# Patient Record
Sex: Male | Born: 1940
Health system: Southern US, Community
[De-identification: ages and names within clinical notes are randomized; demographics above are authoritative.]

## PROBLEM LIST (undated history)

## (undated) DIAGNOSIS — K219 Gastro-esophageal reflux disease without esophagitis: Secondary | ICD-10-CM

## (undated) DIAGNOSIS — I10 Essential (primary) hypertension: Secondary | ICD-10-CM

## (undated) DIAGNOSIS — M0600 Rheumatoid arthritis without rheumatoid factor, unspecified site: Secondary | ICD-10-CM

## (undated) DIAGNOSIS — J449 Chronic obstructive pulmonary disease, unspecified: Secondary | ICD-10-CM

## (undated) DIAGNOSIS — E119 Type 2 diabetes mellitus without complications: Secondary | ICD-10-CM

## (undated) DIAGNOSIS — I700 Atherosclerosis of aorta: Secondary | ICD-10-CM

## (undated) DIAGNOSIS — N1832 Chronic kidney disease, stage 3b: Secondary | ICD-10-CM

---

## 1997-09-29 ENCOUNTER — Ambulatory Visit (HOSPITAL_COMMUNITY): Admission: RE | Admit: 1997-09-29 | Discharge: 1997-09-29 | Payer: Self-pay | Admitting: Family Medicine

## 2020-06-04 ENCOUNTER — Encounter (HOSPITAL_BASED_OUTPATIENT_CLINIC_OR_DEPARTMENT_OTHER): Payer: Self-pay | Admitting: *Deleted

## 2020-06-04 ENCOUNTER — Emergency Department (HOSPITAL_BASED_OUTPATIENT_CLINIC_OR_DEPARTMENT_OTHER): Payer: Medicare HMO

## 2020-06-04 ENCOUNTER — Other Ambulatory Visit: Payer: Self-pay

## 2020-06-04 ENCOUNTER — Inpatient Hospital Stay (HOSPITAL_BASED_OUTPATIENT_CLINIC_OR_DEPARTMENT_OTHER)
Admission: EM | Admit: 2020-06-04 | Discharge: 2020-06-11 | DRG: 641 | Disposition: A | Discharge status: Home / Self Care | Payer: Medicare HMO | Attending: Internal Medicine | Admitting: Internal Medicine

## 2020-06-04 DIAGNOSIS — Z7982 Long term (current) use of aspirin: Secondary | ICD-10-CM

## 2020-06-04 DIAGNOSIS — N179 Acute kidney failure, unspecified: Secondary | ICD-10-CM | POA: Diagnosis present

## 2020-06-04 DIAGNOSIS — R410 Disorientation, unspecified: Secondary | ICD-10-CM

## 2020-06-04 DIAGNOSIS — I10 Essential (primary) hypertension: Secondary | ICD-10-CM | POA: Diagnosis present

## 2020-06-04 DIAGNOSIS — E119 Type 2 diabetes mellitus without complications: Secondary | ICD-10-CM

## 2020-06-04 DIAGNOSIS — K219 Gastro-esophageal reflux disease without esophagitis: Secondary | ICD-10-CM | POA: Diagnosis present

## 2020-06-04 DIAGNOSIS — J449 Chronic obstructive pulmonary disease, unspecified: Secondary | ICD-10-CM | POA: Diagnosis present

## 2020-06-04 DIAGNOSIS — E663 Overweight: Secondary | ICD-10-CM | POA: Diagnosis present

## 2020-06-04 DIAGNOSIS — I129 Hypertensive chronic kidney disease with stage 1 through stage 4 chronic kidney disease, or unspecified chronic kidney disease: Secondary | ICD-10-CM | POA: Diagnosis present

## 2020-06-04 DIAGNOSIS — N19 Unspecified kidney failure: Secondary | ICD-10-CM

## 2020-06-04 DIAGNOSIS — Z20822 Contact with and (suspected) exposure to covid-19: Secondary | ICD-10-CM | POA: Diagnosis present

## 2020-06-04 DIAGNOSIS — E875 Hyperkalemia: Principal | ICD-10-CM | POA: Diagnosis present

## 2020-06-04 DIAGNOSIS — R112 Nausea with vomiting, unspecified: Secondary | ICD-10-CM | POA: Diagnosis not present

## 2020-06-04 DIAGNOSIS — R197 Diarrhea, unspecified: Secondary | ICD-10-CM | POA: Diagnosis present

## 2020-06-04 DIAGNOSIS — E872 Acidosis: Secondary | ICD-10-CM | POA: Diagnosis present

## 2020-06-04 DIAGNOSIS — N1832 Chronic kidney disease, stage 3b: Secondary | ICD-10-CM

## 2020-06-04 DIAGNOSIS — E440 Moderate protein-calorie malnutrition: Secondary | ICD-10-CM | POA: Insufficient documentation

## 2020-06-04 DIAGNOSIS — Z79899 Other long term (current) drug therapy: Secondary | ICD-10-CM

## 2020-06-04 DIAGNOSIS — Z6825 Body mass index (BMI) 25.0-25.9, adult: Secondary | ICD-10-CM

## 2020-06-04 DIAGNOSIS — Z7984 Long term (current) use of oral hypoglycemic drugs: Secondary | ICD-10-CM

## 2020-06-04 DIAGNOSIS — H6122 Impacted cerumen, left ear: Secondary | ICD-10-CM | POA: Diagnosis present

## 2020-06-04 DIAGNOSIS — Z880 Allergy status to penicillin: Secondary | ICD-10-CM

## 2020-06-04 DIAGNOSIS — Z87891 Personal history of nicotine dependence: Secondary | ICD-10-CM

## 2020-06-04 DIAGNOSIS — E860 Dehydration: Secondary | ICD-10-CM | POA: Diagnosis present

## 2020-06-04 DIAGNOSIS — M0600 Rheumatoid arthritis without rheumatoid factor, unspecified site: Secondary | ICD-10-CM | POA: Diagnosis present

## 2020-06-04 DIAGNOSIS — E1122 Type 2 diabetes mellitus with diabetic chronic kidney disease: Secondary | ICD-10-CM | POA: Diagnosis present

## 2020-06-04 HISTORY — DX: Type 2 diabetes mellitus without complications: E11.9

## 2020-06-04 HISTORY — DX: Chronic kidney disease, stage 3b: N18.32

## 2020-06-04 HISTORY — DX: Gastro-esophageal reflux disease without esophagitis: K21.9

## 2020-06-04 HISTORY — DX: Atherosclerosis of aorta: I70.0

## 2020-06-04 HISTORY — DX: Chronic obstructive pulmonary disease, unspecified: J44.9

## 2020-06-04 HISTORY — DX: Essential (primary) hypertension: I10

## 2020-06-04 HISTORY — DX: Rheumatoid arthritis without rheumatoid factor, unspecified site: M06.00

## 2020-06-04 LAB — COMPREHENSIVE METABOLIC PANEL
ALT: 26 U/L (ref 0–44)
ALT: 22 U/L (ref 0–44)
AST: 18 U/L (ref 15–41)
AST: 20 U/L (ref 15–41)
Albumin: 3.9 g/dL (ref 3.5–5.0)
Albumin: 3.4 g/dL — ABNORMAL LOW (ref 3.5–5.0)
Alkaline Phosphatase: 61 U/L (ref 38–126)
Alkaline Phosphatase: 55 U/L (ref 38–126)
Anion gap: 8 (ref 5–15)
Anion gap: 9 (ref 5–15)
BUN: 94 mg/dL — ABNORMAL HIGH (ref 8–23)
BUN: 77 mg/dL — ABNORMAL HIGH (ref 8–23)
CO2: 18 mmol/L — ABNORMAL LOW (ref 22–32)
CO2: 18 mmol/L — ABNORMAL LOW (ref 22–32)
Calcium: 8.6 mg/dL — ABNORMAL LOW (ref 8.9–10.3)
Calcium: 8.1 mg/dL — ABNORMAL LOW (ref 8.9–10.3)
Chloride: 112 mmol/L — ABNORMAL HIGH (ref 98–111)
Chloride: 114 mmol/L — ABNORMAL HIGH (ref 98–111)
Creatinine, Ser: 2.84 mg/dL — ABNORMAL HIGH (ref 0.61–1.24)
Creatinine, Ser: 2.33 mg/dL — ABNORMAL HIGH (ref 0.61–1.24)
GFR, Estimated: 22 mL/min — ABNORMAL LOW (ref >60)
GFR, Estimated: 28 mL/min — ABNORMAL LOW (ref >60)
Glucose, Bld: 116 mg/dL — ABNORMAL HIGH (ref 70–99)
Glucose, Bld: 159 mg/dL — ABNORMAL HIGH (ref 70–99)
Potassium: >7.5 mmol/L (ref 3.5–5.1)
Potassium: 6 mmol/L — ABNORMAL HIGH (ref 3.5–5.1)
Sodium: 138 mmol/L (ref 135–145)
Sodium: 141 mmol/L (ref 135–145)
Total Bilirubin: 0.5 mg/dL (ref 0.3–1.2)
Total Bilirubin: 0.6 mg/dL (ref 0.3–1.2)
Total Protein: 7.3 g/dL (ref 6.5–8.1)
Total Protein: 6.1 g/dL — ABNORMAL LOW (ref 6.5–8.1)

## 2020-06-04 LAB — BASIC METABOLIC PANEL
Anion gap: 8 (ref 5–15)
Anion gap: 8 (ref 5–15)
Anion gap: 9 (ref 5–15)
BUN: 92 mg/dL — ABNORMAL HIGH (ref 8–23)
BUN: 92 mg/dL — ABNORMAL HIGH (ref 8–23)
BUN: 84 mg/dL — ABNORMAL HIGH (ref 8–23)
CO2: 17 mmol/L — ABNORMAL LOW (ref 22–32)
CO2: 17 mmol/L — ABNORMAL LOW (ref 22–32)
CO2: 18 mmol/L — ABNORMAL LOW (ref 22–32)
Calcium: 8.4 mg/dL — ABNORMAL LOW (ref 8.9–10.3)
Calcium: 8.9 mg/dL (ref 8.9–10.3)
Calcium: 8 mg/dL — ABNORMAL LOW (ref 8.9–10.3)
Chloride: 114 mmol/L — ABNORMAL HIGH (ref 98–111)
Chloride: 114 mmol/L — ABNORMAL HIGH (ref 98–111)
Chloride: 113 mmol/L — ABNORMAL HIGH (ref 98–111)
Creatinine, Ser: 2.63 mg/dL — ABNORMAL HIGH (ref 0.61–1.24)
Creatinine, Ser: 2.72 mg/dL — ABNORMAL HIGH (ref 0.61–1.24)
Creatinine, Ser: 2.29 mg/dL — ABNORMAL HIGH (ref 0.61–1.24)
GFR, Estimated: 24 mL/min — ABNORMAL LOW (ref >60)
GFR, Estimated: 23 mL/min — ABNORMAL LOW (ref >60)
GFR, Estimated: 29 mL/min — ABNORMAL LOW (ref >60)
Glucose, Bld: 117 mg/dL — ABNORMAL HIGH (ref 70–99)
Glucose, Bld: 134 mg/dL — ABNORMAL HIGH (ref 70–99)
Glucose, Bld: 162 mg/dL — ABNORMAL HIGH (ref 70–99)
Potassium: 7.4 mmol/L (ref 3.5–5.1)
Potassium: 7 mmol/L (ref 3.5–5.1)
Potassium: 6.1 mmol/L — ABNORMAL HIGH (ref 3.5–5.1)
Sodium: 139 mmol/L (ref 135–145)
Sodium: 139 mmol/L (ref 135–145)
Sodium: 140 mmol/L (ref 135–145)

## 2020-06-04 LAB — URINALYSIS, ROUTINE W REFLEX MICROSCOPIC
Bilirubin Urine: NEGATIVE
Glucose, UA: NEGATIVE mg/dL
Hgb urine dipstick: NEGATIVE
Ketones, ur: NEGATIVE mg/dL
Leukocytes,Ua: NEGATIVE
Nitrite: NEGATIVE
Protein, ur: NEGATIVE mg/dL
Specific Gravity, Urine: 1.02 (ref 1.005–1.030)
pH: 5 (ref 5.0–8.0)

## 2020-06-04 LAB — CBC
HCT: 40 % (ref 39.0–52.0)
Hemoglobin: 13.2 g/dL (ref 13.0–17.0)
MCH: 32.2 pg (ref 26.0–34.0)
MCHC: 33 g/dL (ref 30.0–36.0)
MCV: 97.6 fL (ref 80.0–100.0)
Platelets: 295 10*3/uL (ref 150–400)
RBC: 4.1 MIL/uL — ABNORMAL LOW (ref 4.22–5.81)
RDW: 12.9 % (ref 11.5–15.5)
WBC: 7.6 10*3/uL (ref 4.0–10.5)
nRBC: 0 % (ref 0.0–0.2)

## 2020-06-04 LAB — RESP PANEL BY RT-PCR (FLU A&B, COVID) ARPGX2
Influenza A by PCR: NEGATIVE
Influenza B by PCR: NEGATIVE
SARS Coronavirus 2 by RT PCR: NEGATIVE

## 2020-06-04 LAB — LIPASE, BLOOD: Lipase: 114 U/L — ABNORMAL HIGH (ref 11–51)

## 2020-06-04 LAB — MAGNESIUM: Magnesium: 1.2 mg/dL — ABNORMAL LOW (ref 1.7–2.4)

## 2020-06-04 MED ORDER — CALCIUM GLUCONATE 10 % IV SOLN
1.0000 g | Freq: Once | INTRAVENOUS | Status: DC
  Filled 2020-06-04: qty 10

## 2020-06-04 MED ORDER — SODIUM BICARBONATE 8.4 % IV SOLN
INTRAVENOUS | Status: DC
  Filled 2020-06-04 (×4): qty 850

## 2020-06-04 MED ORDER — SODIUM CHLORIDE 0.9 % IV SOLN
INTRAVENOUS | Status: DC | PRN
  Administered 2020-06-04: 500 mL via INTRAVENOUS

## 2020-06-04 MED ORDER — SODIUM CHLORIDE 0.9 % IV BOLUS
1000.0000 mL | Freq: Once | INTRAVENOUS | Status: AC
  Administered 2020-06-04: 1000 mL via INTRAVENOUS

## 2020-06-04 MED ORDER — MAGNESIUM SULFATE 50 % IJ SOLN
2.0000 g | Freq: Once | INTRAMUSCULAR | Status: DC
  Filled 2020-06-04: qty 4

## 2020-06-04 MED ORDER — MAGNESIUM SULFATE 2 GM/50ML IV SOLN
2.0000 g | Freq: Once | INTRAVENOUS | Status: AC
  Administered 2020-06-04: 2 g via INTRAVENOUS
  Filled 2020-06-04: qty 50

## 2020-06-04 MED ORDER — SODIUM ZIRCONIUM CYCLOSILICATE 10 G PO PACK
10.0000 g | PACK | Freq: Once | ORAL | Status: AC
  Administered 2020-06-04: 10 g via ORAL
  Filled 2020-06-04: qty 1

## 2020-06-04 MED ORDER — INSULIN ASPART 100 UNIT/ML IV SOLN
5.0000 [IU] | Freq: Once | INTRAVENOUS | Status: DC
  Filled 2020-06-04: qty 0.05

## 2020-06-04 MED ORDER — ALBUTEROL SULFATE HFA 108 (90 BASE) MCG/ACT IN AERS
INHALATION_SPRAY | RESPIRATORY_TRACT | Status: AC
  Administered 2020-06-04: 4 via RESPIRATORY_TRACT
  Filled 2020-06-04: qty 6.7

## 2020-06-04 MED ORDER — CALCIUM GLUCONATE-NACL 1-0.675 GM/50ML-% IV SOLN: 1.0000 g | Freq: Once | INTRAVENOUS | Status: AC

## 2020-06-04 MED ORDER — ALBUTEROL (5 MG/ML) CONTINUOUS INHALATION SOLN
2.5000 mg/h | INHALATION_SOLUTION | RESPIRATORY_TRACT | Status: AC
  Administered 2020-06-04 – 2020-06-05 (×5): 2.5 mg/h via RESPIRATORY_TRACT
  Filled 2020-06-04: qty 20

## 2020-06-04 MED ORDER — SODIUM ZIRCONIUM CYCLOSILICATE 10 G PO PACK
10.0000 g | PACK | Freq: Two times a day (BID) | ORAL | Status: AC
  Administered 2020-06-04 – 2020-06-05 (×2): 10 g via ORAL
  Filled 2020-06-04 (×2): qty 1

## 2020-06-04 MED ORDER — SODIUM CHLORIDE 0.9 % IV BOLUS
500.0000 mL | Freq: Once | INTRAVENOUS | Status: AC
  Administered 2020-06-04: 500 mL via INTRAVENOUS

## 2020-06-04 MED ORDER — SODIUM BICARBONATE 8.4 % IV SOLN
INTRAVENOUS | Status: AC
  Filled 2020-06-04: qty 150

## 2020-06-04 MED ORDER — INSULIN ASPART 100 UNIT/ML ~~LOC~~ SOLN
SUBCUTANEOUS | Status: AC
  Administered 2020-06-04: 5 [IU] via INTRAVENOUS
  Filled 2020-06-04: qty 1

## 2020-06-04 MED ORDER — STERILE WATER FOR INJECTION IV SOLN
INTRAVENOUS | Status: DC
  Filled 2020-06-04: qty 850

## 2020-06-04 MED ORDER — INSULIN ASPART 100 UNIT/ML ~~LOC~~ SOLN: 5.0000 [IU] | Freq: Once | SUBCUTANEOUS | Status: AC

## 2020-06-04 MED ORDER — ALBUTEROL SULFATE HFA 108 (90 BASE) MCG/ACT IN AERS: 4.0000 | INHALATION_SPRAY | Freq: Once | RESPIRATORY_TRACT | Status: AC

## 2020-06-04 MED ORDER — ALBUTEROL SULFATE (2.5 MG/3ML) 0.083% IN NEBU
10.0000 mg | INHALATION_SOLUTION | Freq: Once | RESPIRATORY_TRACT | Status: DC
  Filled 2020-06-04: qty 12

## 2020-06-04 MED ORDER — DEXTROSE 50 % IV SOLN
1.0000 | Freq: Once | INTRAVENOUS | Status: AC
  Administered 2020-06-04: 50 mL via INTRAVENOUS
  Filled 2020-06-04: qty 50

## 2020-06-04 MED ORDER — CALCIUM GLUCONATE-NACL 1-0.675 GM/50ML-% IV SOLN
INTRAVENOUS | Status: AC
  Administered 2020-06-04: 1000 mg via INTRAVENOUS
  Filled 2020-06-04: qty 50

## 2020-06-04 NOTE — ED Provider Notes (Signed)
[facility] EMERGENCY DEPARTMENT Provider Note   CSN: [id number] Arrival date & time: 06/04/20  1145     History Chief Complaint  Patient presents with  . Emesis  . Diarrhea    Steven Chang is a 79 y.o. male history of GERD and hypertension.  Majority of history obtained from patient's son who is at bedside.  Patient's son reports he moved in with the patient on 5 weeks ago to help take care of him.  He has noticed that over the past 5 weeks patient has had daily episodes of nonbloody/nonbilious vomiting nonbloody diarrhea and has not eaten a full meal since that time.  He feels the patient has been confused for 5 weeks as well often forgetful and not making sense when he talks.  On exam patient is alert to self and place confused to year reports that is 2022 but does know Christmas was 2 days ago.  He does not know why he was brought in today reports that he feels well and has no complaints  Denies fever/chills, fall/injury, headache, chest pain/shortness of breath, dysuria/hematuria, extremity swelling/color change or any additional concerns.  Level 5 caveat altered mental status  HPI     Past Medical History:  Diagnosis Date  . GERD (gastroesophageal reflux disease)   . Hypertension     Patient Active Problem List   Diagnosis Date Noted  . Hyperkalemia 06/04/2020    History reviewed. No pertinent surgical history.     No family history on file.  Social History   Tobacco Use  . Smoking status: Former Games developermoker  . Smokeless tobacco: Never Used  Substance Use Topics  . Alcohol use: Not Currently  . Drug use: Never    Home Medications Prior to Admission medications   Medication Sig Start Date End Date Taking? Authorizing Provider  amLODipine (NORVASC) 5 MG tablet Take 1 tablet by mouth daily. 12/26/19  Yes [provider]  aspirin 81 MG EC tablet Take by mouth. 01/16/17  Yes [provider]  atorvastatin (LIPITOR) 20 MG tablet Take 1  tablet by mouth daily. 06/07/14  Yes [provider]  Cholecalciferol 25 MCG (1000 UT) tablet Take by mouth.   Yes [provider]  furosemide (LASIX) 20 MG tablet Take 1 tablet by mouth daily. 03/22/20  Yes [provider]  glipiZIDE (GLUCOTROL XL) 5 MG 24 hr tablet TAKE ONE TABLET BY MOUTH ONE TIME DAILY, MAY TAKE EXTRA TABLET IF BLOOD SUGAR IS HIGH 06/07/14  Yes [provider]  lisinopril (ZESTRIL) 40 MG tablet Take 1 tablet by mouth daily. 11/22/19  Yes [provider]  Magnesium Chloride 64 MG TBEC Take by mouth. 04/19/19  Yes [provider]  metFORMIN (GLUCOPHAGE) 500 MG tablet Take by mouth. 04/19/19  Yes [provider]  metoprolol succinate (TOPROL-XL) 50 MG 24 hr tablet Take 1 tablet by mouth daily. 11/22/19  Yes [provider]  pantoprazole (PROTONIX) 40 MG tablet Take one tabs (40 mg) twice daily for two weeks. Then decrease to one tab (40mg ) daily. 12/14/19  Yes [provider]  zolpidem (AMBIEN) 10 MG tablet Take 1 tablet by mouth at bedtime as needed. 10/19/15  Yes [provider]    Allergies    Penicillins  Review of Systems   Review of Systems  Unable to perform ROS: Mental status change    Physical Exam Updated Vital Signs BP (!) 114/49   Pulse 62   Temp 97.8 F (36.6 C) (Oral)  Resp (!) 24   Ht 5\' 8"  (1.727 m)   Wt 79.4 kg   SpO2 99%   BMI 26.61 kg/m   Physical Exam Constitutional:      General: He is not in acute distress.    Appearance: Normal appearance. He is well-developed. He is obese. He is not ill-appearing or diaphoretic.  HENT:     Head: Normocephalic and atraumatic.  Eyes:     General: Vision grossly intact. Gaze aligned appropriately.     Pupils: Pupils are equal, round, and reactive to light.  Neck:     Trachea: Trachea and phonation normal.  Cardiovascular:     Rate and Rhythm: Normal rate and regular rhythm.  Pulmonary:     Effort: Pulmonary effort  is normal. No respiratory distress.  Abdominal:     General: There is no distension.     Palpations: Abdomen is soft.     Tenderness: There is no abdominal tenderness. There is no guarding or rebound.  Musculoskeletal:        General: Normal range of motion.     Cervical back: Normal range of motion.  Skin:    General: Skin is warm and dry.  Neurological:     Mental Status: He is alert.     GCS: GCS eye subscore is 4. GCS verbal subscore is 4. GCS motor subscore is 6.     Comments: Speech is clear and goal oriented, follows commands Major Cranial nerves without deficit, no facial droop Moves extremities without ataxia, coordination intact  Psychiatric:        Behavior: Behavior normal.     ED Results / Procedures / Treatments   Labs (all labs ordered are listed, but only abnormal results are displayed) Labs Reviewed  LIPASE, BLOOD - Abnormal; Notable for the following components:      Result Value   Lipase 114 (*)    All other components within normal limits  COMPREHENSIVE METABOLIC PANEL - Abnormal; Notable for the following components:   Potassium >7.5 (*)    Chloride 112 (*)    CO2 18 (*)    Glucose, Bld 116 (*)    BUN 94 (*)    Creatinine, Ser 2.84 (*)    Calcium 8.6 (*)    GFR, Estimated 22 (*)    All other components within normal limits  CBC - Abnormal; Notable for the following components:   RBC 4.10 (*)    All other components within normal limits  BASIC METABOLIC PANEL - Abnormal; Notable for the following components:   Potassium 7.4 (*)    Chloride 114 (*)    CO2 17 (*)    Glucose, Bld 117 (*)    BUN 92 (*)    Creatinine, Ser 2.63 (*)    Calcium 8.4 (*)    GFR, Estimated 24 (*)    All other components within normal limits  MAGNESIUM - Abnormal; Notable for the following components:   Magnesium 1.2 (*)    All other components within normal limits  BASIC METABOLIC PANEL - Abnormal; Notable for the following components:   Potassium 7.0 (*)    Chloride  114 (*)    CO2 17 (*)    Glucose, Bld 134 (*)    BUN 92 (*)    Creatinine, Ser 2.72 (*)    GFR, Estimated 23 (*)    All other components within normal limits  RESP PANEL BY RT-PCR (FLU A&B, COVID) [id number]  URINALYSIS, ROUTINE W REFLEX MICROSCOPIC  BASIC  METABOLIC PANEL  BASIC METABOLIC PANEL    EKG None  Radiology CT ABDOMEN PELVIS WO CONTRAST  Result Date: 06/04/2020 CLINICAL DATA:  Vomiting and diarrhea EXAM: CT ABDOMEN AND PELVIS WITHOUT CONTRAST TECHNIQUE: Multidetector CT imaging of the abdomen and pelvis was performed following the standard protocol without IV contrast. COMPARISON:  12/13/2019 FINDINGS: Lower chest: No acute abnormality. Hepatobiliary: Few punctate calcified granulomas. Gallbladder is unremarkable. No biliary dilatation. Pancreas: Unremarkable. Spleen: Unremarkable. Adrenals/Urinary Tract: Adrenals are unremarkable. Atrophic right kidney unchanged partially calcified upper pole cyst and nonobstructing upper pole renal calculus. Left renal cysts unchanged. Bladder is unremarkable. Stomach/Bowel: Stomach is within normal limits. Bowel is normal in caliber. Normal appendix. Vascular/Lymphatic: Aortic atherosclerosis. Some intimal calcification is displaced relative to the outer wall and may reflect chronic dissection. This is unchanged. No enlarged lymph nodes. Reproductive: Stable appearance of prostate. Other: No ascites.  No acute abnormality of the abdominal wall. Musculoskeletal: No acute osseous abnormality. Lumbar degenerative changes are greatest at L5-S1. IMPRESSION: No acute abnormality. 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 Electronically Signed   By: Guadlupe Spanish M.D.   On: 06/04/2020 15:02   CT Head Wo Contrast  Result Date: 06/04/2020 CLINICAL DATA:  Mental status change, unknown cause. Additional history provided: Confusion for 5 weeks, mental status change, vomiting, diarrhea, no appetite. EXAM: CT HEAD WITHOUT CONTRAST TECHNIQUE:  Contiguous axial images were obtained from the base of the skull through the vertex without intravenous contrast. COMPARISON:  Brain MRI 05/30/2017. Head CT 05/30/2017. FINDINGS: Brain: Mildly motion degraded exam. Mild cerebral atrophy. 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 Background mild ill-defined hypoattenuation within the cerebral white matter is nonspecific, but compatible with chronic small vessel ischemic disease. There is no acute intracranial hemorrhage. No demarcated cortical infarct. No extra-axial fluid collection. No evidence of intracranial mass. No midline shift. Vascular: No hyperdense vessel.  Atherosclerotic calcifications Skull: Mildly motion degraded exam. Sinuses/Orbits: Visualized orbits show no acute finding. No significant paranasal sinus disease at the imaged levels. IMPRESSION: Mildly motion degraded exam. No evidence of acute intracranial abnormality. 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 Stable background mild generalized atrophy and chronic small vessel ischemic disease. Electronically Signed   By: Jackey Loge DO   On: 06/04/2020 14:59   DG Chest Portable 1 View  Result Date: 06/04/2020 CLINICAL DATA:  Altered mental status EXAM: PORTABLE CHEST 1 VIEW COMPARISON:  December 13, 2019 FINDINGS: Lungs are clear. Heart size and pulmonary vascularity are normal. No adenopathy. There is aortic atherosclerosis. No bone lesions. IMPRESSION: Lungs clear.  Cardiac silhouette normal. Aortic Atherosclerosis (ICD10-I70.0). Electronically Signed   By: Bretta Bang III M.D.   On: 06/04/2020 14:08    Procedures .Critical Care Performed by: Bill Salinas, PA-C Authorized by: Bill Salinas, PA-C   Critical care provider statement:    Critical care time (minutes):  50   Critical care was necessary to treat or prevent imminent or life-threatening  deterioration of the following conditions:  Metabolic crisis and dehydration   Critical care was time spent personally by me on the following activities:  Discussions with consultants, evaluation of patient's response to treatment, examination of patient, ordering and performing treatments and interventions, ordering and review of laboratory studies, ordering and review of radiographic studies, pulse oximetry, re-evaluation of patient's condition, obtaining history from patient or surrogate, review of old charts and development of treatment plan with patient or surrogate   (including  critical care time)  Medications Ordered in ED Medications  0.9 %  sodium chloride infusion ( Intravenous Stopped 06/04/20 1714)  sodium bicarbonate 1 mEq/mL injection (has no administration in time range)  sodium bicarbonate 150 mEq in dextrose 5 % 1,000 mL infusion ( Intravenous New Bag/Given 06/04/20 1716)  albuterol (PROVENTIL,VENTOLIN) solution continuous neb (has no administration in time range)  sodium zirconium cyclosilicate (LOKELMA) packet 10 g (has no administration in time range)  sodium chloride 0.9 % bolus 500 mL (0 mLs Intravenous Stopped 06/04/20 1506)  dextrose 50 % solution 50 mL (50 mLs Intravenous Given 06/04/20 1511)  calcium gluconate 1 g/ 50 mL sodium chloride IVPB (0 g Intravenous Stopped 06/04/20 1604)  insulin aspart (novoLOG) injection 5 Units (5 Units Intravenous Given 06/04/20 1515)  albuterol (VENTOLIN HFA) 108 (90 Base) MCG/ACT inhaler 4 puff (4 puffs Inhalation Given 06/04/20 1516)  magnesium sulfate IVPB 2 g 50 mL (0 g Intravenous Stopped 06/04/20 1713)  sodium zirconium cyclosilicate (LOKELMA) packet 10 g (10 g Oral Given 06/04/20 1608)  sodium chloride 0.9 % bolus 1,000 mL (1,000 mLs Intravenous New Bag/Given 06/04/20 1622)    ED Course  I have reviewed the triage vital signs and the nursing notes.  Pertinent labs & imaging results that were available during my care of the  patient were reviewed by me and considered in my medical decision making (see chart for details).  Clinical Course as of 06/04/20 1726  Mon Jun 04, 2020  1614 Dr. Ronalee Belts [BM]  1648 Dr. Chipper Herb [BM]    Clinical Course User Index [BM] Elizabeth Palau   MDM Rules/Calculators/A&P                         Additional history obtained from: 1. Nursing notes from this visit. 2. Family member. 3. Review of electronic medical records. ------------------- I ordered, reviewed and interpreted labs which include: CBC without leukocytosis to suggest bacterial infection, no anemia. Lipase elevated at 114, no priors to compare. Covid/influenza panel negative. Urinalysis without evidence of infection. Magnesium low 1.2, will begin repletion. CMP shows severe hyperkalemia greater than 7.5 will repeat to ensure accuracy.  Creatinine elevated at 2.4 from 1.9 last month.  Uremia of 94 suspect may be be the cause of patient's altered mental status.  BUN April 17, 2020 was 28.  LFTs within normal limits.  No gap.  CTAP:  IMPRESSION:  No acute abnormality.    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     CT Head:  IMPRESSION:  Mildly motion degraded exam.    No evidence of acute intracranial abnormality.    Redemonstrated chronic small-vessel infarcts within the left corona  radiata and right basal ganglia.    Stable background mild generalized atrophy and chronic small vessel  ischemic disease.     CXR:  IMPRESSION:  Lungs clear. Cardiac silhouette normal.    Aortic Atherosclerosis (ICD10-I70.0).  - EKG reviewed with Dr. Stevie Kern shows no acute findings.  Patient on cardiac monitor.  Patient seen and evaluated by Dr. Stevie Kern.  Plan of care is repeating BMP and then consulted nephrology.   - Patient reevaluated he is resting comfortably no acute distress son is at bedside.  Repeat BMP showed continued hyperkalemia at 7.4.  Patient was started on treatment  including insulin/dextrose, albuterol and calcium gluconate.  Consult was placed to nephrologist Dr. Ronalee Belts.  Consulted Dr. Ronalee Belts at 4:14 PM, he advised repeating the level again, giving patient sodium bicarbonate  and additional normal saline bolus. - 4:48 PM: Consulted Dr. Chipper Herb, patient was accepted to hospitalist service.  Patient reassessed he is resting comfortably in bed no acute distress vital signs stable.  Patient and his son are agreeable for admission  Note: Portions of this report may have been transcribed using voice recognition software. Every effort was made to ensure accuracy; however, inadvertent computerized transcription errors may still be present. Final Clinical Impression(s) / ED Diagnoses Final diagnoses:  Hyperkalemia  AKI (acute kidney injury) (HCC)  Hypomagnesemia  Confusion  Uremia    Rx / DC Orders ED Discharge Orders    None       Elizabeth Palau 06/04/20 1727    Milagros Loll, MD 06/07/20 2344

## 2020-06-04 NOTE — ED Triage Notes (Signed)
For 5 weeks he has been sick. Vomiting, diarrhea, no appetite. He is alert oriented at triage. He has not been seen by his MD for the symptoms. His son is with him.

## 2020-06-04 NOTE — ED Notes (Signed)
Patient denies pain and is resting comfortably.  

## 2020-06-05 ENCOUNTER — Encounter (HOSPITAL_COMMUNITY): Payer: Self-pay | Admitting: Internal Medicine

## 2020-06-05 DIAGNOSIS — E119 Type 2 diabetes mellitus without complications: Secondary | ICD-10-CM

## 2020-06-05 DIAGNOSIS — E875 Hyperkalemia: Secondary | ICD-10-CM | POA: Diagnosis present

## 2020-06-05 DIAGNOSIS — N179 Acute kidney failure, unspecified: Secondary | ICD-10-CM | POA: Diagnosis present

## 2020-06-05 DIAGNOSIS — N1832 Chronic kidney disease, stage 3b: Secondary | ICD-10-CM | POA: Diagnosis present

## 2020-06-05 DIAGNOSIS — Z20822 Contact with and (suspected) exposure to covid-19: Secondary | ICD-10-CM | POA: Diagnosis present

## 2020-06-05 DIAGNOSIS — E663 Overweight: Secondary | ICD-10-CM | POA: Diagnosis present

## 2020-06-05 DIAGNOSIS — M0600 Rheumatoid arthritis without rheumatoid factor, unspecified site: Secondary | ICD-10-CM | POA: Diagnosis present

## 2020-06-05 DIAGNOSIS — R197 Diarrhea, unspecified: Secondary | ICD-10-CM | POA: Diagnosis present

## 2020-06-05 DIAGNOSIS — E872 Acidosis: Secondary | ICD-10-CM | POA: Diagnosis present

## 2020-06-05 DIAGNOSIS — I129 Hypertensive chronic kidney disease with stage 1 through stage 4 chronic kidney disease, or unspecified chronic kidney disease: Secondary | ICD-10-CM | POA: Diagnosis present

## 2020-06-05 DIAGNOSIS — E1122 Type 2 diabetes mellitus with diabetic chronic kidney disease: Secondary | ICD-10-CM | POA: Diagnosis present

## 2020-06-05 DIAGNOSIS — Z6825 Body mass index (BMI) 25.0-25.9, adult: Secondary | ICD-10-CM | POA: Diagnosis not present

## 2020-06-05 DIAGNOSIS — Z7982 Long term (current) use of aspirin: Secondary | ICD-10-CM | POA: Diagnosis not present

## 2020-06-05 DIAGNOSIS — R112 Nausea with vomiting, unspecified: Secondary | ICD-10-CM | POA: Diagnosis present

## 2020-06-05 DIAGNOSIS — K219 Gastro-esophageal reflux disease without esophagitis: Secondary | ICD-10-CM | POA: Diagnosis present

## 2020-06-05 DIAGNOSIS — H6122 Impacted cerumen, left ear: Secondary | ICD-10-CM | POA: Diagnosis present

## 2020-06-05 DIAGNOSIS — E860 Dehydration: Secondary | ICD-10-CM | POA: Diagnosis present

## 2020-06-05 DIAGNOSIS — Z79899 Other long term (current) drug therapy: Secondary | ICD-10-CM | POA: Diagnosis not present

## 2020-06-05 DIAGNOSIS — E440 Moderate protein-calorie malnutrition: Secondary | ICD-10-CM | POA: Diagnosis present

## 2020-06-05 DIAGNOSIS — I10 Essential (primary) hypertension: Secondary | ICD-10-CM | POA: Diagnosis present

## 2020-06-05 DIAGNOSIS — Z7984 Long term (current) use of oral hypoglycemic drugs: Secondary | ICD-10-CM | POA: Diagnosis not present

## 2020-06-05 DIAGNOSIS — J449 Chronic obstructive pulmonary disease, unspecified: Secondary | ICD-10-CM | POA: Diagnosis present

## 2020-06-05 DIAGNOSIS — Z87891 Personal history of nicotine dependence: Secondary | ICD-10-CM | POA: Diagnosis not present

## 2020-06-05 DIAGNOSIS — Z880 Allergy status to penicillin: Secondary | ICD-10-CM | POA: Diagnosis not present

## 2020-06-05 LAB — BASIC METABOLIC PANEL
Anion gap: 9 (ref 5–15)
Anion gap: 10 (ref 5–15)
BUN: 66 mg/dL — ABNORMAL HIGH (ref 8–23)
BUN: 62 mg/dL — ABNORMAL HIGH (ref 8–23)
CO2: 20 mmol/L — ABNORMAL LOW (ref 22–32)
CO2: 21 mmol/L — ABNORMAL LOW (ref 22–32)
Calcium: 8.5 mg/dL — ABNORMAL LOW (ref 8.9–10.3)
Calcium: 8.2 mg/dL — ABNORMAL LOW (ref 8.9–10.3)
Chloride: 112 mmol/L — ABNORMAL HIGH (ref 98–111)
Chloride: 111 mmol/L (ref 98–111)
Creatinine, Ser: 2.13 mg/dL — ABNORMAL HIGH (ref 0.61–1.24)
Creatinine, Ser: 2.12 mg/dL — ABNORMAL HIGH (ref 0.61–1.24)
GFR, Estimated: 31 mL/min — ABNORMAL LOW (ref >60)
GFR, Estimated: 31 mL/min — ABNORMAL LOW (ref >60)
Glucose, Bld: 139 mg/dL — ABNORMAL HIGH (ref 70–99)
Glucose, Bld: 155 mg/dL — ABNORMAL HIGH (ref 70–99)
Potassium: 5.6 mmol/L — ABNORMAL HIGH (ref 3.5–5.1)
Potassium: 5.8 mmol/L — ABNORMAL HIGH (ref 3.5–5.1)
Sodium: 141 mmol/L (ref 135–145)
Sodium: 142 mmol/L (ref 135–145)

## 2020-06-05 LAB — PHOSPHORUS
Phosphorus: 2.6 mg/dL (ref 2.5–4.6)
Phosphorus: 2.4 mg/dL — ABNORMAL LOW (ref 2.5–4.6)

## 2020-06-05 LAB — RENAL FUNCTION PANEL
Albumin: 3.2 g/dL — ABNORMAL LOW (ref 3.5–5.0)
Anion gap: 10 (ref 5–15)
BUN: 51 mg/dL — ABNORMAL HIGH (ref 8–23)
CO2: 27 mmol/L (ref 22–32)
Calcium: 8.3 mg/dL — ABNORMAL LOW (ref 8.9–10.3)
Chloride: 106 mmol/L (ref 98–111)
Creatinine, Ser: 1.93 mg/dL — ABNORMAL HIGH (ref 0.61–1.24)
GFR, Estimated: 35 mL/min — ABNORMAL LOW (ref >60)
Glucose, Bld: 110 mg/dL — ABNORMAL HIGH (ref 70–99)
Phosphorus: 2.4 mg/dL — ABNORMAL LOW (ref 2.5–4.6)
Potassium: 5.4 mmol/L — ABNORMAL HIGH (ref 3.5–5.1)
Sodium: 143 mmol/L (ref 135–145)

## 2020-06-05 LAB — GLUCOSE, CAPILLARY
Glucose-Capillary: 104 mg/dL — ABNORMAL HIGH (ref 70–99)
Glucose-Capillary: 127 mg/dL — ABNORMAL HIGH (ref 70–99)
Glucose-Capillary: 147 mg/dL — ABNORMAL HIGH (ref 70–99)
Glucose-Capillary: 152 mg/dL — ABNORMAL HIGH (ref 70–99)
Glucose-Capillary: 219 mg/dL — ABNORMAL HIGH (ref 70–99)
Glucose-Capillary: 67 mg/dL — ABNORMAL LOW (ref 70–99)

## 2020-06-05 LAB — HEMOGLOBIN A1C
Hgb A1c MFr Bld: 7.1 % — ABNORMAL HIGH (ref 4.8–5.6)
Mean Plasma Glucose: 157.07 mg/dL

## 2020-06-05 LAB — CBG MONITORING, ED
Glucose-Capillary: 164 mg/dL — ABNORMAL HIGH (ref 70–99)
Glucose-Capillary: 108 mg/dL — ABNORMAL HIGH (ref 70–99)

## 2020-06-05 LAB — MAGNESIUM: Magnesium: 1.5 mg/dL — ABNORMAL LOW (ref 1.7–2.4)

## 2020-06-05 LAB — MRSA PCR SCREENING: MRSA by PCR: NEGATIVE

## 2020-06-05 MED ORDER — SODIUM CHLORIDE 0.9 % IV SOLN: INTRAVENOUS | Status: AC

## 2020-06-05 MED ORDER — MAGNESIUM SULFATE 2 GM/50ML IV SOLN
2.0000 g | Freq: Once | INTRAVENOUS | Status: AC
  Administered 2020-06-05: 2 g via INTRAVENOUS
  Filled 2020-06-05: qty 50

## 2020-06-05 MED ORDER — ACETAMINOPHEN 325 MG PO TABS
650.0000 mg | ORAL_TABLET | Freq: Four times a day (QID) | ORAL | Status: DC | PRN
  Administered 2020-06-05 – 2020-06-11 (×11): 650 mg via ORAL
  Filled 2020-06-05 (×10): qty 2

## 2020-06-05 MED ORDER — ACETAMINOPHEN 650 MG RE SUPP: 650.0000 mg | Freq: Four times a day (QID) | RECTAL | Status: DC | PRN

## 2020-06-05 MED ORDER — ENSURE ENLIVE PO LIQD
237.0000 mL | Freq: Three times a day (TID) | ORAL | Status: DC
  Administered 2020-06-05: 237 mL via ORAL

## 2020-06-05 MED ORDER — ONDANSETRON HCL 4 MG/2ML IJ SOLN: 4.0000 mg | Freq: Four times a day (QID) | INTRAMUSCULAR | Status: DC | PRN

## 2020-06-05 MED ORDER — ZOLPIDEM TARTRATE 5 MG PO TABS
5.0000 mg | ORAL_TABLET | Freq: Every evening | ORAL | Status: DC | PRN
  Administered 2020-06-05 – 2020-06-10 (×5): 5 mg via ORAL
  Filled 2020-06-05 (×6): qty 1

## 2020-06-05 MED ORDER — ENSURE ENLIVE PO LIQD
237.0000 mL | Freq: Two times a day (BID) | ORAL | Status: DC
  Administered 2020-06-05 (×2): 237 mL via ORAL

## 2020-06-05 MED ORDER — HEPARIN SODIUM (PORCINE) 5000 UNIT/ML IJ SOLN
5000.0000 [IU] | Freq: Three times a day (TID) | INTRAMUSCULAR | Status: DC
  Administered 2020-06-05 – 2020-06-11 (×17): 5000 [IU] via SUBCUTANEOUS
  Filled 2020-06-05 (×18): qty 1

## 2020-06-05 MED ORDER — ONDANSETRON HCL 4 MG PO TABS: 4.0000 mg | ORAL_TABLET | Freq: Four times a day (QID) | ORAL | Status: DC | PRN

## 2020-06-05 MED ORDER — INSULIN ASPART 100 UNIT/ML ~~LOC~~ SOLN
0.0000 [IU] | SUBCUTANEOUS | Status: DC
  Administered 2020-06-05 (×2): 1 [IU] via SUBCUTANEOUS
  Administered 2020-06-05: 2 [IU] via SUBCUTANEOUS
  Administered 2020-06-05: 3 [IU] via SUBCUTANEOUS
  Administered 2020-06-07 – 2020-06-08 (×2): 2 [IU] via SUBCUTANEOUS
  Administered 2020-06-09 – 2020-06-10 (×2): 1 [IU] via SUBCUTANEOUS
  Administered 2020-06-10: 2 [IU] via SUBCUTANEOUS
  Administered 2020-06-11 (×2): 1 [IU] via SUBCUTANEOUS

## 2020-06-05 MED ORDER — CHLORHEXIDINE GLUCONATE CLOTH 2 % EX PADS
6.0000 | MEDICATED_PAD | Freq: Every day | CUTANEOUS | Status: DC
  Administered 2020-06-05: 6 via TOPICAL

## 2020-06-05 NOTE — Consult Note (Addendum)
[facility] Nephrology Consultation Note  Requesting MD: EDP and Dr Lorin Glass Reason for consult: AKI on CKD and hyperkalemia.   HPI:  Steven Chang is a 79 y.o. male with history of hypertension, acid reflux, DM, COPD, CKD stage IIIb with baseline creatinine level around 1.9-2 followed by Dr. Janit Pagan presented with around 5 days of nausea vomiting, decreased oral intake, diarrhea, seen as a consultation for the evaluation of hyperkalemia and worsening renal failure. The patient initially came to med [facility] with above complaints.  On arrival the blood pressure was soft.  The initial labs showed potassium level >7.5, BUN 94, creatinine 2.84.  He received about 1.5 L of IVF, Lokelma, insulin, dextrose, calcium in the ER.  He was started on IV sodium bicarbonate.  The repeat potassium level gradually improved to 5.8 this morning.  The creatinine level also improved to 2.12 today which is close to his baseline. Urinalysis bland.  CT scan of abdomen pelvis without renal obstruction. After receiving IV fluid, he feels more energy and has no symptoms.  He denies nausea, vomiting, chest pain, shortness of breath.  He is more concerned about indwelling Foley catheter which is bothering him much. At home he was on lisinopril, Lasix.  He was taking those medications as prescribed.  Denies use of NSAIDs.  No recent IV contrast use.  Creatinine, Ser  Date/Time Value Ref Range Status  06/05/2020 06:06 AM 2.12 (H) 0.61 - 1.24 mg/dL Final  16/03/9603 54:09 AM 2.13 (H) 0.61 - 1.24 mg/dL Final  81/19/1478 29:56 PM 2.33 (H) 0.61 - 1.24 mg/dL Final  21/30/8657 84:69 PM 2.29 (H) 0.61 - 1.24 mg/dL Final  62/95/2841 32:44 PM 2.72 (H) 0.61 - 1.24 mg/dL Final  06/11/7251 66:44 PM 2.63 (H) 0.61 - 1.24 mg/dL Final  03/47/4259 56:38 PM 2.84 (H) 0.61 - 1.24 mg/dL Final     PMHx:   Past Medical History:  Diagnosis Date  . Aortic atherosclerosis (HCC)    question of chronic disection on CT  scans  . Chronic kidney disease, stage 3b (HCC)   . COPD (chronic obstructive pulmonary disease) (HCC)   . DM2 (diabetes mellitus, type 2) (HCC)   . GERD (gastroesophageal reflux disease)   . Hypertension   . 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 No pertinent surgical history.  Family Hx:  Family History  Problem Relation Age of Onset  . Glaucoma Neg Hx   . Macular degeneration Neg Hx     Social History:  reports that he has quit smoking. He has never used smokeless tobacco. He reports previous alcohol use. He reports that he does not use drugs.  Allergies:  Allergies  Allergen Reactions  . Penicillins Hives, Rash and Swelling    Paralyze from waist down [place]     Medications: Prior to Admission medications   Medication Sig Start Date End Date Taking? Authorizing Provider  amLODipine (NORVASC) 5 MG tablet Take 1 tablet by mouth daily. 12/26/19  Yes [provider]  aspirin 81 MG EC tablet Take by mouth. 01/16/17  Yes [provider]  atorvastatin (LIPITOR) 20 MG tablet Take 1 tablet by mouth daily. 06/07/14  Yes [provider]  Cholecalciferol 25 MCG (1000 UT) tablet Take by mouth.   Yes [provider]  furosemide (LASIX) 20 MG tablet Take 1 tablet by mouth daily. 03/22/20  Yes [provider]  glipiZIDE (GLUCOTROL XL) 5 MG 24 hr tablet TAKE ONE  TABLET BY MOUTH ONE TIME DAILY, MAY TAKE EXTRA TABLET IF BLOOD SUGAR IS HIGH 06/07/14  Yes [provider]  lisinopril (ZESTRIL) 40 MG tablet Take 1 tablet by mouth daily. 11/22/19  Yes [provider]  Magnesium Chloride 64 MG TBEC Take by mouth. 04/19/19  Yes [provider]  metFORMIN (GLUCOPHAGE) 500 MG tablet Take by mouth. 04/19/19  Yes [provider]  metoprolol succinate (TOPROL-XL) 50 MG 24 hr tablet Take 1 tablet by mouth daily. 11/22/19  Yes [provider]  pantoprazole (PROTONIX) 40 MG  tablet Take one tabs (40 mg) twice daily for two weeks. Then decrease to one tab (40mg ) daily. 12/14/19  Yes [provider]  zolpidem (AMBIEN) 10 MG tablet Take 1 tablet by mouth at bedtime as needed. 10/19/15  Yes [provider]    I have reviewed the patient's current medications.  Labs:  Results for orders placed or performed during the hospital encounter of 06/04/20 (from the past 48 hour(s))  Lipase, blood     Status: Abnormal   Collection Time: 06/04/20 12:19 PM  Result Value Ref Range   Lipase 114 (H) 11 - 51 U/L    Comment: Performed at [facility], [address] [place], [place]  Comprehensive metabolic panel     Status: Abnormal   Collection Time: 06/04/20 12:19 PM  Result Value Ref Range   Sodium 138 135 - 145 mmol/L   Potassium >7.5 (HH) 3.5 - 5.1 mmol/L    Comment: CRITICAL RESULT CALLED TO, READ BACK BY AND VERIFIED WITH: [email or web address],RN @ 1319 ON 06/04/2020,[email or web address].P    Chloride 112 (H) 98 - 111 mmol/L   CO2 18 (L) 22 - 32 mmol/L   Glucose, Bld 116 (H) 70 - 99 mg/dL    Comment: Glucose reference range applies only to samples taken after fasting for at least 8 hours.   BUN 94 (H) 8 - 23 mg/dL   Creatinine, Ser 06/06/2020 (H) 0.61 - 1.24 mg/dL   Calcium 8.6 (L) 8.9 - 10.3 mg/dL   Total Protein 7.3 6.5 - 8.1 g/dL   Albumin 3.9 3.5 - 5.0 g/dL   AST 18 15 - 41 U/L   ALT 26 0 - 44 U/L   Alkaline Phosphatase 61 38 - 126 U/L   Total Bilirubin 0.5 0.3 - 1.2 mg/dL   GFR, Estimated 22 (L) >60 mL/min    Comment: (NOTE) Calculated using the CKD-EPI Creatinine Equation (2021)    Anion gap 8 5 - 15    Comment: Performed at [facility], [address] [place], [place]  CBC     Status: Abnormal   Collection Time: 06/04/20 12:19 PM  Result Value Ref Range   WBC 7.6 4.0 - 10.5 K/uL   RBC 4.10 (L) 4.22 - 5.81 MIL/uL   Hemoglobin 13.2 13.0 - 17.0 g/dL   HCT 06/06/20 02.3 - 34.3 %   MCV 97.6 80.0 - 100.0 fL   MCH 32.2 26.0 -  34.0 pg   MCHC 33.0 30.0 - 36.0 g/dL   RDW 56.8 61.6 - 83.7 %   Platelets 295 150 - 400 K/uL   nRBC 0.0 0.0 - 0.2 %    Comment: Performed at [facility], [address] [place], [place]  Resp Panel by RT-PCR (Flu A&B, Covid) Nasopharyngeal Swab     Status: None   Collection Time: 06/04/20  1:50 PM   Specimen: Nasopharyngeal Swab;  Nasopharyngeal(NP) swabs in vial transport medium  Result Value Ref Range   SARS Coronavirus 2 by RT PCR NEGATIVE NEGATIVE    Comment: (NOTE) SARS-CoV-2 target nucleic acids are NOT DETECTED.  The SARS-CoV-2 RNA is generally detectable in upper respiratory specimens during the acute phase of infection. The lowest concentration of SARS-CoV-2 viral copies this assay can detect is 138 copies/mL. A negative result does not preclude SARS-Cov-2 infection and should not be used as the sole basis for treatment or other patient management decisions. A negative result may occur with  improper specimen collection/handling, submission of specimen other than nasopharyngeal swab, presence of viral mutation(s) within the areas targeted by this assay, and inadequate number of viral copies(<138 copies/mL). A negative result must be combined with clinical observations, patient history, and epidemiological information. The expected result is Negative.  Fact Sheet for Patients:  BloggerCourse.comhttps://www.fda.gov/media/152166/download  Fact Sheet for Healthcare Providers:  SeriousBroker.ithttps://www.fda.gov/media/152162/download  This test is no t yet approved or cleared by the [place] FDA and  has been authorized for detection and/or diagnosis of SARS-CoV-2 by FDA under an Emergency Use Authorization (EUA). This EUA will remain  in effect (meaning this test can be used) for the duration of the COVID-19 declaration under Section 564(b)(1) of the Act, 21 U.S.C.section 360bbb-3(b)(1), unless the authorization is terminated  or revoked sooner.       Influenza A by PCR  NEGATIVE NEGATIVE   Influenza B by PCR NEGATIVE NEGATIVE    Comment: (NOTE) The Xpert Xpress SARS-CoV-2/FLU/RSV plus assay is intended as an aid in the diagnosis of influenza from Nasopharyngeal swab specimens and should not be used as a sole basis for treatment. Nasal washings and aspirates are unacceptable for Xpert Xpress SARS-CoV-2/FLU/RSV testing.  Fact Sheet for Patients: BloggerCourse.comhttps://www.fda.gov/media/152166/download  Fact Sheet for Healthcare Providers: SeriousBroker.ithttps://www.fda.gov/media/152162/download  This test is not yet approved or cleared by the [place] FDA and has been authorized for detection and/or diagnosis of SARS-CoV-2 by FDA under an Emergency Use Authorization (EUA). This EUA will remain in effect (meaning this test can be used) for the duration of the COVID-19 declaration under Section 564(b)(1) of the Act, 21 U.S.C. section 360bbb-3(b)(1), unless the authorization is terminated or revoked.  Performed at [facility], [address] [place], [place] [address]   Basic metabolic panel     Status: Abnormal   Collection Time: 06/04/20  2:59 PM  Result Value Ref Range   Sodium 139 135 - 145 mmol/L   Potassium 7.4 (HH) 3.5 - 5.1 mmol/L    Comment: CRITICAL RESULT CALLED TO, READ BACK BY AND VERIFIED WITH: [email or web address] @1524  ON 06/04/2020, [email or web address].P    Chloride 114 (H) 98 - 111 mmol/L   CO2 17 (L) 22 - 32 mmol/L   Glucose, Bld 117 (H) 70 - 99 mg/dL    Comment: Glucose reference range applies only to samples taken after fasting for at least 8 hours.   BUN 92 (H) 8 - 23 mg/dL   Creatinine, Ser 6.042.63 (H) 0.61 - 1.24 mg/dL   Calcium 8.4 (L) 8.9 - 10.3 mg/dL   GFR, Estimated 24 (L) >60 mL/min    Comment: (NOTE) Calculated using the CKD-EPI Creatinine Equation (2021)    Anion gap 8 5 - 15    Comment: Performed at [facility], [address] [place], [place] [address]  Magnesium     Status: Abnormal   Collection Time: 06/04/20  2:59 PM  Result  Value Ref Range  Magnesium 1.2 (L) 1.7 - 2.4 mg/dL    Comment: Performed at [facility], [address] [place], [place] [address]  Urinalysis, Routine w reflex microscopic Urine, Clean Catch     Status: None   Collection Time: 06/04/20  3:40 PM  Result Value Ref Range   Color, Urine YELLOW YELLOW   APPearance CLEAR CLEAR   Specific Gravity, Urine 1.020 1.005 - 1.030   pH 5.0 5.0 - 8.0   Glucose, UA NEGATIVE NEGATIVE mg/dL   Hgb urine dipstick NEGATIVE NEGATIVE   Bilirubin Urine NEGATIVE NEGATIVE   Ketones, ur NEGATIVE NEGATIVE mg/dL   Protein, ur NEGATIVE NEGATIVE mg/dL   Nitrite NEGATIVE NEGATIVE   Leukocytes,Ua NEGATIVE NEGATIVE    Comment: Microscopic not done on urines with negative protein, blood, leukocytes, nitrite, or glucose < 500 mg/dL. Performed at [facility], [address] [place], [place] [address]   CBG monitoring, ED     Status: Abnormal   Collection Time: 06/04/20  3:56 PM  Result Value Ref Range   Glucose-Capillary 164 (H) 70 - 99 mg/dL    Comment: Glucose reference range applies only to samples taken after fasting for at least 8 hours.  Basic metabolic panel     Status: Abnormal   Collection Time: 06/04/20  4:09 PM  Result Value Ref Range   Sodium 139 135 - 145 mmol/L   Potassium 7.0 (HH) 3.5 - 5.1 mmol/L    Comment: CRITICAL RESULT CALLED TO, READ BACK BY AND VERIFIED WITH: [email or web address],RN @ 1645 ON 06/04/2020, [email or web address].P    Chloride 114 (H) 98 - 111 mmol/L   CO2 17 (L) 22 - 32 mmol/L   Glucose, Bld 134 (H) 70 - 99 mg/dL    Comment: Glucose reference range applies only to samples taken after fasting for at least 8 hours.   BUN 92 (H) 8 - 23 mg/dL   Creatinine, Ser 3.81 (H) 0.61 - 1.24 mg/dL   Calcium 8.9 8.9 - 82.9 mg/dL   GFR, Estimated 23 (L) >60 mL/min    Comment: (NOTE) Calculated using the CKD-EPI Creatinine Equation (2021)    Anion gap 8 5 - 15    Comment: Performed at [facility], [address] [place], [place] [address]  CBG monitoring, ED     Status: Abnormal   Collection Time: 06/04/20  5:44 PM  Result Value Ref Range   Glucose-Capillary 108 (H) 70 - 99 mg/dL    Comment: Glucose reference range applies only to samples taken after fasting for at least 8 hours.  Basic metabolic panel     Status: Abnormal   Collection Time: 06/04/20  8:00 PM  Result Value Ref Range   Sodium 140 135 - 145 mmol/L   Potassium 6.1 (H) 3.5 - 5.1 mmol/L   Chloride 113 (H) 98 - 111 mmol/L   CO2 18 (L) 22 - 32 mmol/L   Glucose, Bld 162 (H) 70 - 99 mg/dL    Comment: Glucose reference range applies only to samples taken after fasting for at least 8 hours.   BUN 84 (H) 8 - 23 mg/dL   Creatinine, Ser 9.67 (H) 0.61 - 1.24 mg/dL   Calcium 8.0 (L) 8.9 - 10.3 mg/dL   GFR, Estimated 29 (L) >60 mL/min    Comment: (NOTE) Calculated using the CKD-EPI Creatinine Equation (2021)    Anion gap 9 5 - 15    Comment: Performed at [facility], [address] [place], [place] [address]  Comprehensive metabolic panel     Status: Abnormal   Collection Time: 06/04/20 10:00 PM  Result Value Ref Range   Sodium 141 135 - 145 mmol/L   Potassium 6.0 (H) 3.5 - 5.1 mmol/L   Chloride 114 (H) 98 - 111 mmol/L   CO2 18 (L) 22 - 32 mmol/L   Glucose, Bld 159 (H) 70 - 99 mg/dL    Comment: Glucose reference range applies only to samples taken after fasting for at least 8 hours.   BUN 77 (H) 8 - 23 mg/dL   Creatinine, Ser 6.04 (H) 0.61 - 1.24 mg/dL   Calcium 8.1 (L) 8.9 - 10.3 mg/dL   Total Protein 6.1 (L) 6.5 - 8.1 g/dL   Albumin 3.4 (L) 3.5 - 5.0 g/dL   AST 20 15 - 41 U/L   ALT 22 0 - 44 U/L   Alkaline Phosphatase 55 38 - 126 U/L   Total Bilirubin 0.6 0.3 - 1.2 mg/dL   GFR, Estimated 28 (L) >60 mL/min    Comment: (NOTE) Calculated using the CKD-EPI Creatinine Equation (2021)    Anion gap 9 5 - 15    Comment: Performed at [facility], [address] [place], [place] [address]  MRSA PCR Screening     Status:  None   Collection Time: 06/05/20  2:12 AM   Specimen: Nasopharyngeal  Result Value Ref Range   MRSA by PCR NEGATIVE NEGATIVE    Comment:        The GeneXpert MRSA Assay (FDA approved for NASAL specimens only), is one component of a comprehensive MRSA colonization surveillance program. It is not intended to diagnose MRSA infection nor to guide or monitor treatment for MRSA infections. Performed at [facility], [address], [place], [place] [address]   Basic metabolic panel     Status: Abnormal   Collection Time: 06/05/20  3:53 AM  Result Value Ref Range   Sodium 141 135 - 145 mmol/L   Potassium 5.6 (H) 3.5 - 5.1 mmol/L   Chloride 112 (H) 98 - 111 mmol/L   CO2 20 (L) 22 - 32 mmol/L   Glucose, Bld 139 (H) 70 - 99 mg/dL    Comment: Glucose reference range applies only to samples taken after fasting for at least 8 hours.   BUN 66 (H) 8 - 23 mg/dL   Creatinine, Ser 7.82 (H) 0.61 - 1.24 mg/dL   Calcium 8.5 (L) 8.9 - 10.3 mg/dL   GFR, Estimated 31 (L) >60 mL/min    Comment: (NOTE) Calculated using the CKD-EPI Creatinine Equation (2021)    Anion gap 9 5 - 15    Comment: Performed at [facility], [address] [place], [place] [address]  Phosphorus     Status: None   Collection Time: 06/05/20  3:53 AM  Result Value Ref Range   Phosphorus 2.6 2.5 - 4.6 mg/dL    Comment: Performed at [facility], [address], [place], [place] [address]  Hemoglobin A1c     Status: Abnormal   Collection Time: 06/05/20  3:53 AM  Result Value Ref Range   Hgb A1c MFr Bld 7.1 (H) 4.8 - 5.6 %    Comment: (NOTE) Pre diabetes:          5.7%-6.4%  Diabetes:              >6.4%  Glycemic control for   <7.0% adults with  diabetes    Mean Plasma Glucose 157.07 mg/dL    Comment: Performed at [facility], [address], [place], [place] [address]  Glucose, capillary     Status: Abnormal   Collection Time: 06/05/20  5:15 AM  Result Value Ref Range   Glucose-Capillary  152 (H) 70 - 99 mg/dL    Comment: Glucose reference range applies only to samples taken after fasting for at least 8 hours.  Basic metabolic panel     Status: Abnormal   Collection Time: 06/05/20  6:06 AM  Result Value Ref Range   Sodium 142 135 - 145 mmol/L   Potassium 5.8 (H) 3.5 - 5.1 mmol/L   Chloride 111 98 - 111 mmol/L   CO2 21 (L) 22 - 32 mmol/L   Glucose, Bld 155 (H) 70 - 99 mg/dL    Comment: Glucose reference range applies only to samples taken after fasting for at least 8 hours.   BUN 62 (H) 8 - 23 mg/dL   Creatinine, Ser 6.04 (H) 0.61 - 1.24 mg/dL   Calcium 8.2 (L) 8.9 - 10.3 mg/dL   GFR, Estimated 31 (L) >60 mL/min    Comment: (NOTE) Calculated using the CKD-EPI Creatinine Equation (2021)    Anion gap 10 5 - 15    Comment: Performed at [facility], [address] [place], [place] [address]  Magnesium     Status: Abnormal   Collection Time: 06/05/20  6:06 AM  Result Value Ref Range   Magnesium 1.5 (L) 1.7 - 2.4 mg/dL    Comment: Performed at [facility], [address] [place], [place] [address]  Phosphorus     Status: Abnormal   Collection Time: 06/05/20  6:06 AM  Result Value Ref Range   Phosphorus 2.4 (L) 2.5 - 4.6 mg/dL    Comment: Performed at [facility], [address], [place], [place] [address]  Glucose, capillary     Status: Abnormal   Collection Time: 06/05/20  7:37 AM  Result Value Ref Range   Glucose-Capillary 127 (H) 70 - 99 mg/dL    Comment: Glucose reference range applies only to samples taken after fasting for at least 8 hours.  Glucose, capillary     Status: Abnormal   Collection Time: 06/05/20 11:37 AM  Result Value Ref Range   Glucose-Capillary 147 (H) 70 - 99 mg/dL    Comment: Glucose reference range applies only to samples taken after fasting for at least 8 hours.     ROS:  Pertinent items noted in HPI and remainder of comprehensive ROS otherwise negative.  Physical Exam: Vitals:   06/05/20 0300 06/05/20 0744   BP: 126/74 (!) 97/55  Pulse: 80 86  Resp: 17 19  Temp: 98.1 F (36.7 C) 98.5 F (36.9 C)  SpO2: 93%      General exam: Appears calm and comfortable  Respiratory system: Clear to auscultation. Respiratory effort normal. No wheezing or crackle Cardiovascular system: S1 & S2 heard, RRR.  No pedal edema. Gastrointestinal system: Abdomen is nondistended, soft and nontender. Normal bowel sounds heard. Central nervous system: Alert and oriented. No focal neurological deficits. Extremities: Symmetric 5 x 5 power. Skin: No rashes, lesions or ulcers Psychiatry: Judgement and insight appear normal. Mood & affect appropriate.  GU: Indwelling Foley catheter with clear urine.  Assessment/Plan:  #Severe hyperkalemia due to worsening renal failure concomitant with the use of lisinopril.  Received IV fluid, insulin, dextrose, calcium, Lokelma, bicarbonate with significant improvement  of potassium to 5.8 today.  Clinically improved as well. Repeat renal panel now. Hold lisinopril on discharge.  May be able to resume by his outpatient nephrologist.  #Acute kidney injury on CKD 3b hemodynamically mediated in the setting of severe dehydration concomitant with the use of diuretics and ACE inhibitor.  Urinalysis bland and a CT scan ruled out obstruction.  The creatinine level improved to baseline after IV hydration.  I recommend to hold both Lasix and lisinopril on discharge.  This can be resumed as outpatient. He is very uncomfortable with indwelling Foley catheter.  I do not see any documentation about bladder obstruction.  Discontinue Foley catheter and bladder scan.  #Metabolic acidosis: Improved with IV bicarbonate.  Switch IVF to NS.  Able to discontinue IV fluid by tomorrow morning.  #Hypertension: Blood pressure soft off antihypertensive.  Monitor BP.  #Nausea vomiting and diarrhea: CT scan of abdomen pelvis without acute finding.  Clinically improved with IV hydration.  Repeating lab now.  If  potassium level better then I will sign off.  Patient can follow-up with his nephrologist at [facility].  Plan as outlined above.  Please call back with question.  Thank you for the consult.  Discussed with the primary team.  Maxie Barb 06/05/2020, 11:38 AM   [facility]

## 2020-06-05 NOTE — Progress Notes (Signed)
PROGRESS NOTE  Steven Chang  DOB: 09/13/1940  PCP: Leola Brazil, DO KDT:[id number]  DOA: 06/04/2020  LOS: 0 days   Chief Complaint  Patient presents with  . Emesis  . Diarrhea   Brief narrative: Steven Chang is a 79 y.o. male with PMH significant for DM2, HTN, CKD, COPD rheumatoid arthritis, GERD. Patient presented to the ED on 12/27 with increasing frequency of confusion episodes for last 5 to 6 weeks.  Also reported nausea, vomiting, diarrhea for the same duration.  In the ED, patient was afebrile, hemodynamically stable. Initial labs showed potassium level significantly elevated to more than 7.5, with creatinine 2.84. CT head no acute findings. CT AP did not show any acute findings (just a question of chronic aortic dissection given displacement of calcium plaque, but this is unchanged since at least July 2021).  Nephrology was called from ED.  Patient was started on bicarbonate drip.  He was also given lokelma, IVF boluses, calcium gluconate, insulin + D50. Admitted under hospitalist service.  Subjective: Patient was seen and examined this morning.  Pleasant elderly Caucasian male.  Sitting up in chair.  Feels tired.  Patient states he has chronic intermittent diarrhea but this time it is ongoing for 5 to 6 weeks. Chart reviewed.  Heart rate in 70s and 80s.  Blood pressure 97/55 this morning Labs this morning with potassium at 5.8.  Assessment/Plan: Acute hyperkalemia -Initially was significantly elevated to 7.5. Patient was started on bicarbonate drip. He was also given lokelma, IVF boluses, calcium gluconate, insulin + D50. -Nephrology consult appreciated.  Fluid was switched to normal saline 75 mill per hour.  Repeat renal panel. Recent Labs  Lab 06/04/20 1219 06/04/20 1459 06/04/20 1609 06/04/20 2000 06/04/20 2200 06/05/20 0353 06/05/20 0606  K >7.5* 7.4* 7.0* 6.1* 6.0* 5.6* 5.8*   Hypomagnesemia -Magnesium level low at 1.5 this morning.  IV magnesium  replacement given. Recent Labs  Lab 06/04/20 1459 06/05/20 0606  MG 1.2* 1.5*   AKI on CKD 3b -Suspect pre-renal / ATN due to dehydration from poor PO intake, N/V/D for 5 weeks. -Foley catheter was inserted in the ED.  More than 1300 mL urine output since admission. -BUN/creatinine improving -Discontinue Foley catheter.  Voiding trial. -Baseline creatinine 1.92.  Follows up with Dr. Janit Pagan at [facility]. -Repeat creatinine tomorrow. Recent Labs    06/04/20 1219 06/04/20 1459 06/04/20 1609 06/04/20 2000 06/04/20 2200 06/05/20 0353 06/05/20 0606  BUN 94* 92* 92* 84* 77* 66* 62*  CREATININE 2.84* 2.63* 2.72* 2.29* 2.33* 2.13* 2.12*  CO2 18* 17* 17* 18* 18* 20* 21*   Nausea vomiting diarrhea for 5 weeks -Sent for GI pathogen panel including C. difficile screen. -Symptomatic management with IV antiemetics. -Continue to monitor diarrhea.  Diabetes mellitus type 2 -A1c 7.1 on 12/28. -Home meds include Glipizide 5 mg daily, Metformin 500 mg daily -Currently on hold.  Continue sliding scale insulin with Accu-Cheks. Recent Labs  Lab 06/04/20 1556 06/04/20 1744 06/05/20 0515 06/05/20 0737 06/05/20 1137  GLUCAP 164* 108* 152* 127* 147*   Essential hypertension -Blood pressure currently running in normal to low normal range. -Home Meds include Toprol 50 mg daily amlodipine 5 mg daily, Lasix 20 mg daily, lisinopril 40 mg daily. -Resume Toprol.  Keep others on hold.  Per nephrology recommendation, do not resume Lasix and lisinopril at discharge. -Continue to monitor blood pressure.  COPD  GERD -On Protonix 40 mg daily.  Mobility: Uses a cane at home.  PT eval ordered.  Code Status:   Code Status: Full Code  Nutritional status: Body mass index is 25.78 kg/m.     Diet Order            Diet Carb Modified Fluid consistency: Thin; Room service appropriate? Yes  Diet effective now                 DVT prophylaxis: heparin injection 5,000 Units Start: 06/05/20 0600    Antimicrobials:  None Fluid: Normal saline at 75 mL/h Consultants: Nephrology Family Communication:  None at bedside  Status is: Inpatient  Remains inpatient appropriate because -needs IV hydration, monitoring of diarrhea  Dispo: The patient is from: Home              Anticipated d/c is to: Home              Anticipated d/c date is: 2-3 days              Patient currently is not medically stable to d/c.   Infusions:  . sodium chloride Stopped (06/04/20 1714)  . sodium chloride    . magnesium sulfate bolus IVPB      Scheduled Meds: . Chlorhexidine Gluconate Cloth  6 each Topical Daily  . feeding supplement  237 mL Oral BID BM  . heparin  5,000 Units Subcutaneous Q8H  . insulin aspart  0-9 Units Subcutaneous Q4H    Antimicrobials: Anti-infectives (From admission, onward)   None      PRN meds: sodium chloride, acetaminophen **OR** acetaminophen, ondansetron **OR** ondansetron (ZOFRAN) IV   Objective: Vitals:   06/05/20 0300 06/05/20 0744  BP: 126/74 (!) 97/55  Pulse: 80 86  Resp: 17 19  Temp: 98.1 F (36.7 C) 98.5 F (36.9 C)  SpO2: 93%     Intake/Output Summary (Last 24 hours) at 06/05/2020 1241 Last data filed at 06/05/2020 0400 Gross per 24 hour  Intake 3304.73 ml  Output 1375 ml  Net 1929.73 ml   Filed Weights   06/04/20 1209 06/05/20 0210  Weight: 79.4 kg 76.9 kg   Weight change:  Body mass index is 25.78 kg/m.   Physical Exam: General exam: Pleasant, elderly Caucasian male.  Not in physical distress Skin: No rashes, lesions or ulcers. HEENT: Atraumatic, normocephalic, no obvious bleeding Lungs: Clear to auscultation bilaterally CVS: Regular rate and rhythm, no murmur GI/Abd soft, gaseous distention present, nontender, bowel sound present CNS: Alert, awake, oriented x3 Psychiatry: Mood appropriate Extremities: No pedal edema, no calf tenderness  Data Review: I have personally reviewed the laboratory data and studies available.  Recent  Labs  Lab 06/04/20 1219  WBC 7.6  HGB 13.2  HCT 40.0  MCV 97.6  PLT 295   Recent Labs  Lab 06/04/20 1459 06/04/20 1609 06/04/20 2000 06/04/20 2200 06/05/20 0353 06/05/20 0606  NA 139 139 140 141 141 142  K 7.4* 7.0* 6.1* 6.0* 5.6* 5.8*  CL 114* 114* 113* 114* 112* 111  CO2 17* 17* 18* 18* 20* 21*  GLUCOSE 117* 134* 162* 159* 139* 155*  BUN 92* 92* 84* 77* 66* 62*  CREATININE 2.63* 2.72* 2.29* 2.33* 2.13* 2.12*  CALCIUM 8.4* 8.9 8.0* 8.1* 8.5* 8.2*  MG 1.2*  --   --   --   --  1.5*  PHOS  --   --   --   --  2.6 2.4*    F/u labs ordered  Signed, Lorin Glass, MD [facility] 06/05/2020

## 2020-06-05 NOTE — Progress Notes (Signed)
Initial Nutrition Assessment  DOCUMENTATION CODES:   Non-severe (moderate) malnutrition in context of chronic illness  INTERVENTION:   - Ensure Enlive po TID, each supplement provides 350 kcal and 20 grams of protein  - Encourage adequate PO intake  NUTRITION DIAGNOSIS:   Moderate Malnutrition related to chronic illness (COPD, CKD) as evidenced by mild fat depletion,moderate muscle depletion.  GOAL:   Patient will meet greater than or equal to 90% of their needs  MONITOR:   PO intake,Supplement acceptance,Labs,Weight trends,I & O's  REASON FOR ASSESSMENT:   Malnutrition Screening Tool    ASSESSMENT:   79 year old male who presented to the ED on 12/27 with N/V/D x 5 weeks and poor PO intake. PMH of GERD, HTN, CKD stage IIIb, T2DM, COPD. Pt admitted with hyperkalemia and AKI.   Spoke with pt at bedside. Pt reports decreased appetite for several weeks PTA. He also reports diarrhea for several weeks PTA. He states that he started eating less around the same time that he started having diarrhea. GI pathogen panel including C difficile screen is pending.  Pt reports that he does not really like the food here at the hospital. He stated that he was able to eat about 50% of his grilled cheese sandwich at lunch today. Pt does enjoy Ensure and Boost supplements and had been consuming 2-3 daily at home PTA due to poor PO intake.  Discussed with pt the importance of adequate PO intake in maintaining lean muscle mass and preventing weight loss. Pt reports that he thinks that he has lost some weight because his hands "look like a bone yard." He reports a UBW of 175 lbs. No weight history available in chart.  Pt does meet criteria for malnutrition. RD will increase Ensure supplement from BID to TID and monitor for acceptance.  Medications reviewed and include: Ensure Enlive BID, SSI q 4 hours, sodium bicarb @ 125 ml/hr  Labs reviewed: potassium 5.8, BUN 62, creatinine 2.12, phosphorus 2.4,  magnesium 1.5, hemoglobin A1C 7.1 CBG's: 108-164 x 24 hours  UOP: 1375 ml x 24 hours I/O's: +1.9 L since admit  NUTRITION - FOCUSED PHYSICAL EXAM:  Flowsheet Row Most Recent Value  Orbital Region Mild depletion  Upper Arm Region Moderate depletion  Thoracic and Lumbar Region Mild depletion  Buccal Region Mild depletion  Temple Region Mild depletion  Clavicle Bone Region Mild depletion  Clavicle and Acromion Bone Region Moderate depletion  Scapular Bone Region Moderate depletion  Dorsal Hand Mild depletion  Patellar Region Mild depletion  Anterior Thigh Region Moderate depletion  Posterior Calf Region Mild depletion  Edema (RD Assessment) None  Hair Reviewed  Eyes Reviewed  Mouth Reviewed  Skin Reviewed  Nails Reviewed       Diet Order:   Diet Order            Diet Carb Modified Fluid consistency: Thin; Room service appropriate? Yes  Diet effective now                 EDUCATION NEEDS:   Education needs have been addressed  Skin:  Skin Assessment: Reviewed RN Assessment  Last BM:  no documented BM  Height:   Ht Readings from Last 1 Encounters:  06/05/20 5\' 8"  (1.727 m)    Weight:   Wt Readings from Last 1 Encounters:  06/05/20 76.9 kg    BMI:  Body mass index is 25.78 kg/m.  Estimated Nutritional Needs:   Kcal:  1850-2050  Protein:  85-100 grams  Fluid:  1.8-2.0  L    Gustavus Bryant, MS, RD, LDN Inpatient Clinical Dietitian Please see AMiON for contact information.

## 2020-06-05 NOTE — H&P (Addendum)
History and Physical    Steven Chang ZOX:[id number] DOB: 1941/04/04 DOA: 06/04/2020  PCP: Leola Brazil, DO  Patient coming from: Home  I have personally briefly reviewed patient's old medical records in [facility]  Chief Complaint: N/V/D  HPI: Steven Chang is a 79 y.o. male with medical history significant of CKD 3b with baseline creat of ~1.9-2, DM2, HTN, COPD.  Pt presents to the ED at [facility] with AMS.  Per son pt with frequent confusion for past 5 weeks or so.  Patient having daily episodes of NBNB emesis, and NB diarrhea for the pats 5 weeks or so.  Poor PO intake as a result.   ED Course: Initially pt disoriented to year in ED.  Work up in ED concerning for K > 7.5 initially confirmed on repeat BMP.  Creat 2.72, BUN 92.  CT head no acute findings.  CT AP: also no acute findings (just a question of chronic aortic dissection given displacement of calcium plaque, but this is unchanged since at least July 2021).  Pt denies abd pain, just occasional "fullness".  Does note that diarrhea going on "for like a month".  No CP, no SOB.  His biggest complaint at the moment is discomfort related to the foley catheter.  Pt given lokelma, IVF boluses, calcium gluconate, insulin + D50.  Nephro called and pt started on bicarb gtt.  Has actually had fairly good UOP since foley placement: At least 1375 out with another 400 documented under "Other Uretheral catheter" but showing up as intake for some reason.   Review of Systems: As per HPI, otherwise all review of systems negative.  Past Medical History:  Diagnosis Date  . Aortic atherosclerosis (HCC)    question of chronic disection on CT scans  . Chronic kidney disease, stage 3b (HCC)   . COPD (chronic obstructive pulmonary disease) (HCC)   . DM2 (diabetes mellitus, type 2) (HCC)   . GERD (gastroesophageal reflux disease)   . Hypertension   . Rheumatoid arthritis without elevated rheumatoid factor (HCC)     History  reviewed. No pertinent surgical history.   reports that he has quit smoking. He has never used smokeless tobacco. He reports previous alcohol use. He reports that he does not use drugs.  Allergies  Allergen Reactions  . Penicillins Hives, Rash and Swelling    Paralyze from waist down [place]     Family History  Problem Relation Age of Onset  . Glaucoma Neg Hx   . Macular degeneration Neg Hx      Prior to Admission medications   Medication Sig Start Date End Date Taking? Authorizing Provider  amLODipine (NORVASC) 5 MG tablet Take 1 tablet by mouth daily. 12/26/19  Yes [provider]  aspirin 81 MG EC tablet Take by mouth. 01/16/17  Yes [provider]  atorvastatin (LIPITOR) 20 MG tablet Take 1 tablet by mouth daily. 06/07/14  Yes [provider]  Cholecalciferol 25 MCG (1000 UT) tablet Take by mouth.   Yes [provider]  furosemide (LASIX) 20 MG tablet Take 1 tablet by mouth daily. 03/22/20  Yes [provider]  glipiZIDE (GLUCOTROL XL) 5 MG 24 hr tablet TAKE ONE TABLET BY MOUTH ONE TIME DAILY, MAY TAKE EXTRA TABLET IF BLOOD SUGAR IS HIGH 06/07/14  Yes [provider]  lisinopril (ZESTRIL) 40 MG tablet Take 1 tablet by mouth daily. 11/22/19  Yes [provider]  Magnesium Chloride 64 MG TBEC Take by mouth. 04/19/19  Yes [provider]  metFORMIN (GLUCOPHAGE) 500 MG tablet Take by mouth. 04/19/19  Yes [provider]  metoprolol succinate (TOPROL-XL) 50 MG 24 hr tablet Take 1 tablet by mouth daily. 11/22/19  Yes [provider]  pantoprazole (PROTONIX) 40 MG tablet Take one tabs (40 mg) twice daily for two weeks. Then decrease to one tab (40mg ) daily. 12/14/19  Yes [provider]  zolpidem (AMBIEN) 10 MG tablet Take 1 tablet by mouth at bedtime as needed. 10/19/15  Yes [provider]    Physical Exam: Vitals:   06/05/20 0000 06/05/20 0020 06/05/20 0030 06/05/20 0210  BP: (!)  116/59  131/65 (!) 141/63  Pulse: 72  74 80  Resp: 15  18 19   Temp:    98.1 F (36.7 C)  TempSrc:    Oral  SpO2: 99% 100% 98% 94%  Weight:    76.9 kg  Height:    5\' 8"  (1.727 m)    Constitutional: NAD, calm, comfortable Eyes: PERRL, lids and conjunctivae normal ENMT: Mucous membranes are moist. Posterior pharynx clear of any exudate or lesions.Normal dentition.  Neck: normal, supple, no masses, no thyromegaly Respiratory: clear to auscultation bilaterally, no wheezing, no crackles. Normal respiratory effort. No accessory muscle use.  Cardiovascular: Regular rate and rhythm, no murmurs / rubs / gallops. No extremity edema. 2+ pedal pulses. No carotid bruits.  Abdomen: no tenderness, no masses palpated. No hepatosplenomegaly. Bowel sounds positive.  Musculoskeletal: no clubbing / cyanosis. No joint deformity upper and lower extremities. Good ROM, no contractures. Normal muscle tone.  Skin: no rashes, lesions, ulcers. No induration Neurologic: CN 2-12 grossly intact. Sensation intact, DTR normal. Strength 5/5 in all 4.  Psychiatric: Normal judgment and insight. Alert and oriented x 3. Normal mood.    Labs on Admission: I have personally reviewed following labs and imaging studies  CBC: Recent Labs  Lab 06/04/20 1219  WBC 7.6  HGB 13.2  HCT 40.0  MCV 97.6  PLT 295   Basic Metabolic Panel: Recent Labs  Lab 06/04/20 1219 06/04/20 1459 06/04/20 1609 06/04/20 2000 06/04/20 2200  NA 138 139 139 140 141  K >7.5* 7.4* 7.0* 6.1* 6.0*  CL 112* 114* 114* 113* 114*  CO2 18* 17* 17* 18* 18*  GLUCOSE 116* 117* 134* 162* 159*  BUN 94* 92* 92* 84* 77*  CREATININE 2.84* 2.63* 2.72* 2.29* 2.33*  CALCIUM 8.6* 8.4* 8.9 8.0* 8.1*  MG  --  1.2*  --   --   --    GFR: Estimated Creatinine Clearance: 25.3 mL/min (A) (by C-G formula based on SCr of 2.33 mg/dL (H)). Liver Function Tests: Recent Labs  Lab 06/04/20 1219 06/04/20 2200  AST 18 20  ALT 26 22  ALKPHOS 61 55  BILITOT 0.5  0.6  PROT 7.3 6.1*  ALBUMIN 3.9 3.4*   Recent Labs  Lab 06/04/20 1219  LIPASE 114*   No results for input(s): AMMONIA in the last 168 hours. Coagulation Profile: No results for input(s): INR, PROTIME in the last 168 hours. Cardiac Enzymes: No results for input(s): CKTOTAL, CKMB, CKMBINDEX, TROPONINI in the last 168 hours. BNP (last 3 results) No results for input(s): PROBNP in the last 8760 hours. HbA1C: No results for input(s): HGBA1C in the last 72 hours. CBG: No results for input(s): GLUCAP in the last 168 hours. Lipid Profile: No results for input(s): CHOL, HDL, LDLCALC, TRIG, CHOLHDL, LDLDIRECT in the last 72 hours. Thyroid Function Tests: No results for input(s): TSH, T4TOTAL, FREET4,  T3FREE, THYROIDAB in the last 72 hours. Anemia Panel: No results for input(s): VITAMINB12, FOLATE, FERRITIN, TIBC, IRON, RETICCTPCT in the last 72 hours. Urine analysis:    Component Value Date/Time   COLORURINE YELLOW 06/04/2020 1540   APPEARANCEUR CLEAR 06/04/2020 1540   LABSPEC 1.020 06/04/2020 1540   PHURINE 5.0 06/04/2020 1540   GLUCOSEU NEGATIVE 06/04/2020 1540   HGBUR NEGATIVE 06/04/2020 1540   BILIRUBINUR NEGATIVE 06/04/2020 1540   KETONESUR NEGATIVE 06/04/2020 1540   PROTEINUR NEGATIVE 06/04/2020 1540   NITRITE NEGATIVE 06/04/2020 1540   LEUKOCYTESUR NEGATIVE 06/04/2020 1540    Radiological Exams on Admission: CT ABDOMEN PELVIS WO CONTRAST  Result Date: 06/04/2020 CLINICAL DATA:  Vomiting and diarrhea EXAM: CT ABDOMEN AND PELVIS WITHOUT CONTRAST TECHNIQUE: Multidetector CT imaging of the abdomen and pelvis was performed following the standard protocol without IV contrast. COMPARISON:  12/13/2019 FINDINGS: Lower chest: No acute abnormality. Hepatobiliary: Few punctate calcified granulomas. Gallbladder is unremarkable. No biliary dilatation. Pancreas: Unremarkable. Spleen: Unremarkable. Adrenals/Urinary Tract: Adrenals are unremarkable. Atrophic right kidney unchanged partially  calcified upper pole cyst and nonobstructing upper pole renal calculus. Left renal cysts unchanged. Bladder is unremarkable. Stomach/Bowel: Stomach is within normal limits. Bowel is normal in caliber. Normal appendix. Vascular/Lymphatic: Aortic atherosclerosis. Some intimal calcification is displaced relative to the outer wall and may reflect chronic dissection. This is unchanged. No enlarged lymph nodes. Reproductive: Stable appearance of prostate. Other: No ascites.  No acute abnormality of the abdominal wall. Musculoskeletal: No acute osseous abnormality. Lumbar degenerative changes are greatest at L5-S1. IMPRESSION: No acute abnormality. 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 Electronically Signed   By: Guadlupe SpanishPraneil  Patel M.D.   On: 06/04/2020 15:02   CT Head Wo Contrast  Result Date: 06/04/2020 CLINICAL DATA:  Mental status change, unknown cause. Additional history provided: Confusion for 5 weeks, mental status change, vomiting, diarrhea, no appetite. EXAM: CT HEAD WITHOUT CONTRAST TECHNIQUE: Contiguous axial images were obtained from the base of the skull through the vertex without intravenous contrast. COMPARISON:  Brain MRI 05/30/2017. Head CT 05/30/2017. FINDINGS: Brain: Mildly motion degraded exam. Mild cerebral atrophy. 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 Background mild ill-defined hypoattenuation within the cerebral white matter is nonspecific, but compatible with chronic small vessel ischemic disease. There is no acute intracranial hemorrhage. No demarcated cortical infarct. No extra-axial fluid collection. No evidence of intracranial mass. No midline shift. Vascular: No hyperdense vessel.  Atherosclerotic calcifications Skull: Mildly motion degraded exam. Sinuses/Orbits: Visualized orbits show no acute finding. No significant paranasal sinus disease at the imaged levels. IMPRESSION: Mildly motion degraded exam. No  evidence of acute intracranial abnormality. 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 Stable background mild generalized atrophy and chronic small vessel ischemic disease. Electronically Signed   By: Jackey LogeKyle  Golden DO   On: 06/04/2020 14:59   DG Chest Portable 1 View  Result Date: 06/04/2020 CLINICAL DATA:  Altered mental status EXAM: PORTABLE CHEST 1 VIEW COMPARISON:  December 13, 2019 FINDINGS: Lungs are clear. Heart size and pulmonary vascularity are normal. No adenopathy. There is aortic atherosclerosis. No bone lesions. IMPRESSION: Lungs clear.  Cardiac silhouette normal. Aortic Atherosclerosis (ICD10-I70.0). Electronically Signed   By: Bretta BangWilliam  Woodruff III M.D.   On: 06/04/2020 14:08    EKG: Independently reviewed.  Assessment/Plan Principal Problem:   Hyperkalemia Active Problems:   AKI (acute kidney injury) (HCC)   DM2 (diabetes mellitus, type 2) (HCC)   HTN (hypertension)  1. Hyperkalemia - 1. K improved to 6.0 2. Got NS, insulin + glucose, albuterol neb, multiple doses of lokelma. 3. Continuing lokelma for the moment for 1 more dose 4. Tele monitor 5. BMP Q6H 2. AKI on CKD 3b - 1. Suspect pre-renal / ATN due to dehydration from poor PO intake, N/V/D for 5 weeks. 2. Has actually had fairly good UOP since foley placement: At least 1375 out with another 400 documented under "Other Uretheral catheter" but showing up as intake for some reason. 3. IVF: 1.5L bolus + isotonic bicarb at 125, first liter has run out (second coming form pharmacy) so total in is at least 2.5L 4. BUN trending down as is creatinine slowly 5. Am hopeful that we are looking at rapid renal recovery 6. Unclear how much initial UOP there was with foley placement (if this was obstructive or not), will leave foley in place for now. 7. Nephro consulted by EDP 3. N/V/D - 1. For 5 weeks 2. Getting GI pathogen pnl 3. And C.Diff screen 4. DM2 - 1. Hold PO  hypoglycemics 2. Sensitive SSI Q4H for the moment 5. HTN - 1. Holding home BP meds for the moment, initial BP in ED was 93/57, now up to 126/74 2. Holding nephrotoxic lisinopril/hctz  DVT prophylaxis: Heparin [place] Code Status: Full Family Communication: No family in room Disposition Plan: home after renal recovery and potassium normalized Consults called: EDP called nephrology Admission status: Admit to inpatient  Severity of Illness: The appropriate patient status for this patient is INPATIENT. Inpatient status is judged to be reasonable and necessary in order to provide the required intensity of service to ensure the patient's safety. The patient's presenting symptoms, physical exam findings, and initial radiographic and laboratory data in the context of their chronic comorbidities is felt to place them at high risk for further clinical deterioration. Furthermore, it is not anticipated that the patient will be medically stable for discharge from the hospital within 2 midnights of admission. The following factors support the patient status of inpatient.   IP status due to AKI with initial K of 7.5!   * I certify that at the point of admission it is my clinical judgment that the patient will require inpatient hospital care spanning beyond 2 midnights from the point of admission due to high intensity of service, high risk for further deterioration and high frequency of surveillance required.*    Tatianna Ibbotson M. DO [facility]  How to contact the [facility] Attending or Consulting provider 7A - 7P or covering provider during after hours 7P -7A, for this patient?  1. Check the care team in [facility] and look for a) attending/consulting [facility] provider listed and b) the [facility] team listed 2. Log into [email or web address]  Amion Physician Scheduling and messaging for groups and whole hospitals  On call and physician scheduling software for group practices, residents, hospitalists and other medical providers  for call, clinic, rotation and shift schedules. On[employer] is a hospital-wide system for scheduling doctors and paging doctors on call. EasyPlot is for scientific plotting and data analysis.  [email or web address]  and use [facility] universal password to access. If you do not have the password, please contact the hospital operator.  3. Locate the [facility] provider you are looking for under [facility] and page to a number that you can be directly reached. 4. If you still have difficulty reaching the provider, please page the [facility] (Director on Call) for the Hospitalists listed on amion for assistance.  06/05/2020,  3:09 AM

## 2020-06-05 NOTE — Plan of Care (Signed)

## 2020-06-05 NOTE — Plan of Care (Signed)
  Problem: Clinical Measurements: Goal: Ability to maintain clinical measurements within normal limits will improve Outcome: Progressing   Problem: Activity: Goal: Risk for activity intolerance will decrease Outcome: Progressing   

## 2020-06-06 DIAGNOSIS — E875 Hyperkalemia: Secondary | ICD-10-CM | POA: Diagnosis not present

## 2020-06-06 DIAGNOSIS — E440 Moderate protein-calorie malnutrition: Secondary | ICD-10-CM | POA: Insufficient documentation

## 2020-06-06 LAB — CBC WITH DIFFERENTIAL/PLATELET
Abs Immature Granulocytes: 0.02 10*3/uL (ref 0.00–0.07)
Basophils Absolute: 0 10*3/uL (ref 0.0–0.1)
Basophils Relative: 0 %
Eosinophils Absolute: 0.3 10*3/uL (ref 0.0–0.5)
Eosinophils Relative: 5 %
HCT: 32 % — ABNORMAL LOW (ref 39.0–52.0)
Hemoglobin: 10.7 g/dL — ABNORMAL LOW (ref 13.0–17.0)
Immature Granulocytes: 0 %
Lymphocytes Relative: 15 %
Lymphs Abs: 1 10*3/uL (ref 0.7–4.0)
MCH: 31.8 pg (ref 26.0–34.0)
MCHC: 33.4 g/dL (ref 30.0–36.0)
MCV: 95.2 fL (ref 80.0–100.0)
Monocytes Absolute: 0.9 10*3/uL (ref 0.1–1.0)
Monocytes Relative: 13 %
Neutro Abs: 4.2 10*3/uL (ref 1.7–7.7)
Neutrophils Relative %: 67 %
Platelets: 207 10*3/uL (ref 150–400)
RBC: 3.36 MIL/uL — ABNORMAL LOW (ref 4.22–5.81)
RDW: 12.6 % (ref 11.5–15.5)
WBC: 6.4 10*3/uL (ref 4.0–10.5)
nRBC: 0 % (ref 0.0–0.2)

## 2020-06-06 LAB — BASIC METABOLIC PANEL
Anion gap: 7 (ref 5–15)
BUN: 44 mg/dL — ABNORMAL HIGH (ref 8–23)
CO2: 25 mmol/L (ref 22–32)
Calcium: 7.5 mg/dL — ABNORMAL LOW (ref 8.9–10.3)
Chloride: 109 mmol/L (ref 98–111)
Creatinine, Ser: 1.9 mg/dL — ABNORMAL HIGH (ref 0.61–1.24)
GFR, Estimated: 36 mL/min — ABNORMAL LOW (ref >60)
Glucose, Bld: 166 mg/dL — ABNORMAL HIGH (ref 70–99)
Potassium: 6.1 mmol/L — ABNORMAL HIGH (ref 3.5–5.1)
Sodium: 141 mmol/L (ref 135–145)

## 2020-06-06 LAB — GLUCOSE, CAPILLARY
Glucose-Capillary: 100 mg/dL — ABNORMAL HIGH (ref 70–99)
Glucose-Capillary: 102 mg/dL — ABNORMAL HIGH (ref 70–99)
Glucose-Capillary: 104 mg/dL — ABNORMAL HIGH (ref 70–99)
Glucose-Capillary: 143 mg/dL — ABNORMAL HIGH (ref 70–99)
Glucose-Capillary: 168 mg/dL — ABNORMAL HIGH (ref 70–99)
Glucose-Capillary: 88 mg/dL (ref 70–99)
Glucose-Capillary: 93 mg/dL (ref 70–99)

## 2020-06-06 LAB — GASTROINTESTINAL PANEL BY PCR, STOOL (REPLACES STOOL CULTURE)

## 2020-06-06 LAB — C DIFFICILE (CDIFF) QUICK SCRN (NO PCR REFLEX)
C Diff antigen: NEGATIVE
C Diff interpretation: NOT DETECTED
C Diff toxin: NEGATIVE

## 2020-06-06 LAB — MAGNESIUM: Magnesium: 2 mg/dL (ref 1.7–2.4)

## 2020-06-06 MED ORDER — NEPRO/CARBSTEADY PO LIQD
237.0000 mL | Freq: Two times a day (BID) | ORAL | Status: DC
  Administered 2020-06-07 – 2020-06-10 (×7): 237 mL via ORAL

## 2020-06-06 MED ORDER — ALBUTEROL SULFATE (2.5 MG/3ML) 0.083% IN NEBU: 2.5000 mg | INHALATION_SOLUTION | RESPIRATORY_TRACT | Status: DC | PRN

## 2020-06-06 MED ORDER — SODIUM CHLORIDE 0.9 % IV SOLN: INTRAVENOUS | Status: AC

## 2020-06-06 MED ORDER — UMECLIDINIUM-VILANTEROL 62.5-25 MCG/INH IN AEPB
1.0000 | INHALATION_SPRAY | Freq: Every day | RESPIRATORY_TRACT | Status: DC
  Administered 2020-06-06 – 2020-06-11 (×4): 1 via RESPIRATORY_TRACT
  Filled 2020-06-06: qty 14

## 2020-06-06 MED ORDER — METOPROLOL SUCCINATE ER 25 MG PO TB24
12.5000 mg | ORAL_TABLET | Freq: Every day | ORAL | Status: DC
  Administered 2020-06-06 – 2020-06-11 (×6): 12.5 mg via ORAL
  Filled 2020-06-06 (×7): qty 1

## 2020-06-06 MED ORDER — ALBUTEROL SULFATE HFA 108 (90 BASE) MCG/ACT IN AERS
2.0000 | INHALATION_SPRAY | RESPIRATORY_TRACT | Status: DC | PRN
  Filled 2020-06-06: qty 6.7

## 2020-06-06 MED ORDER — METOPROLOL SUCCINATE ER 50 MG PO TB24: 50.0000 mg | ORAL_TABLET | Freq: Every day | ORAL | Status: DC

## 2020-06-06 MED ORDER — FUROSEMIDE 10 MG/ML IJ SOLN
40.0000 mg | Freq: Once | INTRAMUSCULAR | Status: AC
  Administered 2020-06-06: 40 mg via INTRAVENOUS
  Filled 2020-06-06: qty 4

## 2020-06-06 MED ORDER — SODIUM ZIRCONIUM CYCLOSILICATE 10 G PO PACK
10.0000 g | PACK | Freq: Two times a day (BID) | ORAL | Status: AC
  Administered 2020-06-06 (×2): 10 g via ORAL
  Filled 2020-06-06 (×2): qty 1

## 2020-06-06 NOTE — Evaluation (Signed)
Physical Therapy Evaluation Patient Details Name: Steven Chang MRN: [id number] DOB: 06/07/1941 Today's Date: 06/06/2020   History of Present Illness  79 y.o. male with medical history significant of CKD 3b with baseline creat of ~1.9-2, DM2, HTN, COPD. Presents to ED12/27 with AMS, periods of emesis and diarrhea over the past 5 weeks. Admitted for kyperkalemia, and AKI  Clinical Impression  PTA pt reports being completely independent with mobility, ADLs and iADLs. Pt lives with wife and son in single story home with 5 steps to enter. Pt is currently limited in safe mobility by decreased strength, balance and endurance. Pt is currently min guard for bed mobility and transfers and light min A for ambulation without AD. PT recommending HHPT to improve strength and balance for safe mobility in his home environment. Pt will likely refuse. PT will continue to follow acutely.    Follow Up Recommendations Home health PT;Supervision/Assistance - 24 hour    Equipment Recommendations  Rolling walker with 5" wheels       Precautions / Restrictions Precautions Precautions: None Precaution Comments: enteric precautions Restrictions Weight Bearing Restrictions: No      Mobility  Bed Mobility Overal bed mobility: Needs Assistance Bed Mobility: Supine to Sit;Sit to Supine     Supine to sit: Supervision;HOB elevated Sit to supine: Supervision;HOB elevated   General bed mobility comments: increased time and effort to come to EoB    Transfers Overall transfer level: Needs assistance Equipment used: None Transfers: Sit to/from Stand Sit to Stand: Min guard         General transfer comment: min guard for safety, good power up and self steadying with increased time  Ambulation/Gait Ambulation/Gait assistance: Min assist Gait Distance (Feet): 20 Feet Assistive device: None Gait Pattern/deviations: Step-through pattern;Decreased step length - right;Decreased step length -  left;Shuffle Gait velocity: slowed Gait velocity interpretation: <1.31 ft/sec, indicative of household ambulator General Gait Details: contact guard assist for mild unsteadiness, no overt LoB, pt with need to urinate once at door returned to use urinal and requested to get back in bed      Balance Overall balance assessment: Needs assistance Sitting-balance support: No upper extremity supported;Feet supported Sitting balance-Leahy Scale: Fair     Standing balance support: No upper extremity supported;During functional activity Standing balance-Leahy Scale: Fair                               Pertinent Vitals/Pain Pain Assessment: No/denies pain    Home Living Family/patient expects to be discharged to:: Private residence Living Arrangements: Spouse/significant other;Children Available Help at Discharge: Family;Available 24 hours/day Type of Home: House Home Access: Stairs to enter Entrance Stairs-Rails: Doctor, general practice of Steps: 5 Home Layout: One level Home Equipment: None      Prior Function Level of Independence: Independent                  Extremity/Trunk Assessment   Upper Extremity Assessment Upper Extremity Assessment: Overall WFL for tasks assessed    Lower Extremity Assessment Lower Extremity Assessment: Generalized weakness;LLE deficits/detail LLE Deficits / Details: pt with brace on L knee "just feels like it needs a littles support", ROM WFL, strength grossly 4/5       Communication   Communication: No difficulties  Cognition Arousal/Alertness: Awake/alert Behavior During Therapy: WFL for tasks assessed/performed Overall Cognitive Status: Within Functional Limits for tasks assessed  General Comments General comments (skin integrity, edema, etc.): Pt on 3L O2 via [place] on entry, reports no supplemental O2 use at home, removed and pt able to maintain SaO2 >90%O2  with mobility, pt with c/o of stuffiness in ears, with decreased ability to hear, he thought it was getting better however seems to be worse today.        Assessment/Plan    PT Assessment Patient needs continued PT services  PT Problem List Decreased strength;Decreased activity tolerance;Decreased balance;Decreased mobility       PT Treatment Interventions DME instruction;Gait training;Stair training;Functional mobility training;Patient/family education    PT Goals (Current goals can be found in the Care Plan section)  Acute Rehab PT Goals Patient Stated Goal: feel better PT Goal Formulation: With patient Time For Goal Achievement: 06/20/20 Potential to Achieve Goals: Good    Frequency Min 3X/week    AM-PAC PT "6 Clicks" Mobility  Outcome Measure Help needed turning from your back to your side while in a flat bed without using bedrails?: None Help needed moving from lying on your back to sitting on the side of a flat bed without using bedrails?: None Help needed moving to and from a bed to a chair (including a wheelchair)?: None Help needed standing up from a chair using your arms (e.g., wheelchair or bedside chair)?: None Help needed to walk in hospital room?: A Little Help needed climbing 3-5 steps with a railing? : A Little 6 Click Score: 22    End of Session Equipment Utilized During Treatment: Gait belt Activity Tolerance: Patient tolerated treatment well Patient left: in bed;with call bell/phone within reach;with bed alarm set Nurse Communication: Mobility status PT Visit Diagnosis: Unsteadiness on feet (R26.81);Other abnormalities of gait and mobility (R26.89);Muscle weakness (generalized) (M62.81)    Time: 9179-1505 PT Time Calculation (min) (ACUTE ONLY): 26 min   Charges:   PT Evaluation $PT Eval Moderate Complexity: 1 Mod PT Treatments $Therapeutic Activity: 8-22 mins        Steven Chang PT, DPT Acute Rehabilitation Services Pager [phone] Office [phone]   Steven Chang 06/06/2020, 10:50 AM

## 2020-06-06 NOTE — Progress Notes (Signed)
Progress Note    Steven Chang  RUE:[id number] DOB: 1940-06-19  DOA: 06/04/2020 PCP: Leola BrazilEverly, Rebecca B, DO    Brief Narrative:     Medical records reviewed and are as summarized below:  Steven Chang is an 79 y.o. male with history of hypertension, diabetes, chronic kidney disease stage IIIb with a creatinine of two.  He presented with nausea and vomiting and was found to have hyperkalemia and worsening renal failure.  Assessment/Plan:   Principal Problem:   Hyperkalemia Active Problems:   AKI (acute kidney injury) (HCC)   DM2 (diabetes mellitus, type 2) (HCC)   HTN (hypertension)   Malnutrition of moderate degree   Severe hyperkalemia: -DC lisinopril -Nephrology consult -IV Lasix, Lokelma  Acute kidney injury on chronic kidney disease 3B: -Holding both Lasix and lisinopril -Improved with IV fluids  Reported diarrhea -GI pathogen panel and C. difficile pending  Hypertension -Restart low-dose beta-blocker  Overweight Body mass index is 25.78 kg/m.   Family Communication/Anticipated D/C date and plan/Code Status   DVT prophylaxis: heparin Code Status: Full Code.  Disposition Plan: Status is: Inpatient  Remains inpatient appropriate because:Inpatient level of care appropriate due to severity of illness   Dispo: The patient is from: Home              Anticipated d/c is to: Home              Anticipated d/c date is: > 3 days              Patient currently is not medically stable to d/c.         Medical Consultants:    renal  Subjective:   Feels like abdomen is distended  Objective:    Vitals:   06/05/20 2015 06/05/20 2322 06/06/20 0400 06/06/20 0723  BP: (!) 114/58 124/63 (!) 109/57 (!) 122/56  Pulse: 74 71 63 67  Resp: 19 19 17 20   Temp: 98.6 F (37 C) 97.9 F (36.6 C) 98.5 F (36.9 C)   TempSrc: Oral Oral Oral   SpO2: 98% 95% 97% 95%  Weight:      Height:        Intake/Output Summary (Last 24 hours) at 06/06/2020 0957 Last  data filed at 06/06/2020 0710 Gross per 24 hour  Intake --  Output 2075 ml  Net -2075 ml   Filed Weights   06/04/20 1209 06/05/20 0210  Weight: 79.4 kg 76.9 kg    Exam:  General: Appearance:     Overweight male in no acute distress     Lungs:     On [place], occ expir wheeze, respirations unlabored  Heart:    Normal heart rate. Normal rhythm. No murmurs, rubs, or gallops.   MS:   All extremities are intact.   Neurologic:   Awake, alert, oriented x 3. No apparent focal neurological           Defect, mildly hard of hearing    Data Reviewed:   I have personally reviewed following labs and imaging studies:  Labs: Labs show the following:   Basic Metabolic Panel: Recent Labs  Lab 06/04/20 1459 06/04/20 1609 06/04/20 2200 06/05/20 0353 06/05/20 0606 06/05/20 1505 06/06/20 0121  NA 139   < > 141 141 142 143 141  K 7.4*   < > 6.0* 5.6* 5.8* 5.4* 6.1*  CL 114*   < > 114* 112* 111 106 109  CO2 17*   < > 18* 20* 21* 27 25  GLUCOSE 117*   < > 159* 139* 155* 110* 166*  BUN 92*   < > 77* 66* 62* 51* 44*  CREATININE 2.63*   < > 2.33* 2.13* 2.12* 1.93* 1.90*  CALCIUM 8.4*   < > 8.1* 8.5* 8.2* 8.3* 7.5*  MG 1.2*  --   --   --  1.5*  --  2.0  PHOS  --   --   --  2.6 2.4* 2.4*  --    < > = values in this interval not displayed.   GFR Estimated Creatinine Clearance: 31 mL/min (A) (by C-G formula based on SCr of 1.9 mg/dL (H)). Liver Function Tests: Recent Labs  Lab 06/04/20 1219 06/04/20 2200 06/05/20 1505  AST 18 20  --   ALT 26 22  --   ALKPHOS 61 55  --   BILITOT 0.5 0.6  --   PROT 7.3 6.1*  --   ALBUMIN 3.9 3.4* 3.2*   Recent Labs  Lab 06/04/20 1219  LIPASE 114*   No results for input(s): AMMONIA in the last 168 hours. Coagulation profile No results for input(s): INR, PROTIME in the last 168 hours.  CBC: Recent Labs  Lab 06/04/20 1219 06/06/20 0121  WBC 7.6 6.4  NEUTROABS  --  4.2  HGB 13.2 10.7*  HCT 40.0 32.0*  MCV 97.6 95.2  PLT 295 207   Cardiac  Enzymes: No results for input(s): CKTOTAL, CKMB, CKMBINDEX, TROPONINI in the last 168 hours. BNP (last 3 results) No results for input(s): PROBNP in the last 8760 hours. CBG: Recent Labs  Lab 06/05/20 2014 06/05/20 2336 06/06/20 0042 06/06/20 0404 06/06/20 0719  GLUCAP 219* 67* 168* 88 93   D-Dimer: No results for input(s): DDIMER in the last 72 hours. Hgb A1c: Recent Labs    06/05/20 0353  HGBA1C 7.1*   Lipid Profile: No results for input(s): CHOL, HDL, LDLCALC, TRIG, CHOLHDL, LDLDIRECT in the last 72 hours. Thyroid function studies: No results for input(s): TSH, T4TOTAL, T3FREE, THYROIDAB in the last 72 hours.  Invalid input(s): FREET3 Anemia work up: No results for input(s): VITAMINB12, FOLATE, FERRITIN, TIBC, IRON, RETICCTPCT in the last 72 hours. Sepsis Labs: Recent Labs  Lab 06/04/20 1219 06/06/20 0121  WBC 7.6 6.4    Microbiology Recent Results (from the past 240 hour(s))  Resp Panel by RT-PCR (Flu A&B, Covid) Nasopharyngeal Swab     Status: None   Collection Time: 06/04/20  1:50 PM   Specimen: Nasopharyngeal Swab; Nasopharyngeal(NP) swabs in vial transport medium  Result Value Ref Range Status   SARS Coronavirus 2 by RT PCR NEGATIVE NEGATIVE Final    Comment: (NOTE) SARS-CoV-2 target nucleic acids are NOT DETECTED.  The SARS-CoV-2 RNA is generally detectable in upper respiratory specimens during the acute phase of infection. The lowest concentration of SARS-CoV-2 viral copies this assay can detect is 138 copies/mL. A negative result does not preclude SARS-Cov-2 infection and should not be used as the sole basis for treatment or other patient management decisions. A negative result may occur with  improper specimen collection/handling, submission of specimen other than nasopharyngeal swab, presence of viral mutation(s) within the areas targeted by this assay, and inadequate number of viral copies(<138 copies/mL). A negative result must be combined  with clinical observations, patient history, and epidemiological information. The expected result is Negative.  Fact Sheet for Patients:  BloggerCourse.com  Fact Sheet for Healthcare Providers:  SeriousBroker.it  This test is no t yet approved or cleared by the [place]  FDA and  has been authorized for detection and/or diagnosis of SARS-CoV-2 by FDA under an Emergency Use Authorization (EUA). This EUA will remain  in effect (meaning this test can be used) for the duration of the COVID-19 declaration under Section 564(b)(1) of the Act, 21 U.S.C.section 360bbb-3(b)(1), unless the authorization is terminated  or revoked sooner.       Influenza A by PCR NEGATIVE NEGATIVE Final   Influenza B by PCR NEGATIVE NEGATIVE Final    Comment: (NOTE) The Xpert Xpress SARS-CoV-2/FLU/RSV plus assay is intended as an aid in the diagnosis of influenza from Nasopharyngeal swab specimens and should not be used as a sole basis for treatment. Nasal washings and aspirates are unacceptable for Xpert Xpress SARS-CoV-2/FLU/RSV testing.  Fact Sheet for Patients: BloggerCourse.com  Fact Sheet for Healthcare Providers: SeriousBroker.it  This test is not yet approved or cleared by the [place] FDA and has been authorized for detection and/or diagnosis of SARS-CoV-2 by FDA under an Emergency Use Authorization (EUA). This EUA will remain in effect (meaning this test can be used) for the duration of the COVID-19 declaration under Section 564(b)(1) of the Act, 21 U.S.C. section 360bbb-3(b)(1), unless the authorization is terminated or revoked.  Performed at [facility], [address] [place], [place] [address]   MRSA PCR Screening     Status: None   Collection Time: 06/05/20  2:12 AM   Specimen: Nasopharyngeal  Result Value Ref Range Status   MRSA by PCR NEGATIVE NEGATIVE Final     Comment:        The GeneXpert MRSA Assay (FDA approved for NASAL specimens only), is one component of a comprehensive MRSA colonization surveillance program. It is not intended to diagnose MRSA infection nor to guide or monitor treatment for MRSA infections. Performed at [facility], [address] [place], [place] [address]     Procedures and diagnostic studies:  CT ABDOMEN PELVIS WO CONTRAST  Result Date: 06/04/2020 CLINICAL DATA:  Vomiting and diarrhea EXAM: CT ABDOMEN AND PELVIS WITHOUT CONTRAST TECHNIQUE: Multidetector CT imaging of the abdomen and pelvis was performed following the standard protocol without IV contrast. COMPARISON:  12/13/2019 FINDINGS: Lower chest: No acute abnormality. Hepatobiliary: Few punctate calcified granulomas. Gallbladder is unremarkable. No biliary dilatation. Pancreas: Unremarkable. Spleen: Unremarkable. Adrenals/Urinary Tract: Adrenals are unremarkable. Atrophic right kidney unchanged partially calcified upper pole cyst and nonobstructing upper pole renal calculus. Left renal cysts unchanged. Bladder is unremarkable. Stomach/Bowel: Stomach is within normal limits. Bowel is normal in caliber. Normal appendix. Vascular/Lymphatic: Aortic atherosclerosis. Some intimal calcification is displaced relative to the outer wall and may reflect chronic dissection. This is unchanged. No enlarged lymph nodes. Reproductive: Stable appearance of prostate. Other: No ascites.  No acute abnormality of the abdominal wall. Musculoskeletal: No acute osseous abnormality. Lumbar degenerative changes are greatest at L5-S1. IMPRESSION: No acute abnormality. 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 Electronically Signed   By: Guadlupe Spanish M.D.   On: 06/04/2020 15:02   CT Head Wo Contrast  Result Date: 06/04/2020 CLINICAL DATA:  Mental status change, unknown cause. Additional history provided: Confusion for 5 weeks, mental status change, vomiting, diarrhea,  no appetite. EXAM: CT HEAD WITHOUT CONTRAST TECHNIQUE: Contiguous axial images were obtained from the base of the skull through the vertex without intravenous contrast. COMPARISON:  Brain MRI 05/30/2017. Head CT 05/30/2017. FINDINGS: Brain: Mildly motion degraded exam. Mild cerebral atrophy. 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 Background mild  ill-defined hypoattenuation within the cerebral white matter is nonspecific, but compatible with chronic small vessel ischemic disease. There is no acute intracranial hemorrhage. No demarcated cortical infarct. No extra-axial fluid collection. No evidence of intracranial mass. No midline shift. Vascular: No hyperdense vessel.  Atherosclerotic calcifications Skull: Mildly motion degraded exam. Sinuses/Orbits: Visualized orbits show no acute finding. No significant paranasal sinus disease at the imaged levels. IMPRESSION: Mildly motion degraded exam. No evidence of acute intracranial abnormality. 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 Stable background mild generalized atrophy and chronic small vessel ischemic disease. Electronically Signed   By: Jackey Loge DO   On: 06/04/2020 14:59   DG Chest Portable 1 View  Result Date: 06/04/2020 CLINICAL DATA:  Altered mental status EXAM: PORTABLE CHEST 1 VIEW COMPARISON:  December 13, 2019 FINDINGS: Lungs are clear. Heart size and pulmonary vascularity are normal. No adenopathy. There is aortic atherosclerosis. No bone lesions. IMPRESSION: Lungs clear.  Cardiac silhouette normal. Aortic Atherosclerosis (ICD10-I70.0). Electronically Signed   By: Bretta Bang III M.D.   On: 06/04/2020 14:08    Medications:   . Chlorhexidine Gluconate Cloth  6 each Topical Daily  . feeding supplement  237 mL Oral TID BM  . heparin  5,000 Units Subcutaneous Q8H  . insulin aspart  0-9 Units Subcutaneous Q4H  . metoprolol  succinate  12.5 mg Oral Daily  . sodium zirconium cyclosilicate  10 g Oral BID  . umeclidinium-vilanterol  1 puff Inhalation Daily   Continuous Infusions: . sodium chloride Stopped (06/04/20 1714)  . sodium chloride 100 mL/hr at 06/06/20 0846     LOS: 1 day   Joseph Art  [facility]   How to contact the [facility] Attending or Consulting provider 7A - 7P or covering provider during after hours 7P -7A, for this patient?  1. Check the care team in [facility] and look for a) attending/consulting [facility] provider listed and b) the [facility] team listed 2. Log into [email or web address] and use [place]'s universal password to access. If you do not have the password, please contact the hospital operator. 3. Locate the [facility] provider you are looking for under [facility] and page to a number that you can be directly reached. 4. If you still have difficulty reaching the provider, please page the [facility] (Director on Call) for the Hospitalists listed on amion for assistance.  06/06/2020, 9:57 AM

## 2020-06-06 NOTE — Plan of Care (Signed)

## 2020-06-06 NOTE — Progress Notes (Signed)
[facility] NEPHROLOGY PROGRESS NOTE  Assessment/ Plan: Pt is a 79 y.o. 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 Janit Pagan presented with around 5 days of nausea vomiting, decreased oral intake, diarrhea, seen as a consultation for the evaluation of hyperkalemia and worsening renal failure.  #Severe hyperkalemia due to worsening renal failure concomitant with the use of lisinopril.  -Received medical treatment for hyperkalemia with improvement of potassium initially.  Today the potassium elevated to 6.1.  I will order a dose of Lasix IV, Lokelma and continue IV fluids.  Continue to hold lisinopril.  Monitor lab.   #Acute kidney injury on CKD 3b hemodynamically mediated in the setting of severe dehydration concomitant with the use of diuretics and ACE inhibitor.  Urinalysis bland and a CT scan ruled out obstruction.  The creatinine level improved to his baseline. I recommend to hold both Lasix and lisinopril on discharge.  This can be resumed as outpatient.  #Metabolic acidosis: Improved with IV bicarbonate.  Switch IVF to NS.   #Hypertension: Blood pressure soft off antihypertensive.  Monitor BP.  #Nausea vomiting and diarrhea: CT scan of abdomen pelvis without acute finding.  Clinically improved with IV hydration.  Subjective: Seen and examined.  Chart reviewed.  Urine output around 1.8 L in 24 hours.  Denies nausea vomiting chest pain shortness breath. Objective Vital signs in last 24 hours: Vitals:   06/05/20 2015 06/05/20 2322 06/06/20 0400 06/06/20 0723  BP: (!) 114/58 124/63 (!) 109/57 (!) 122/56  Pulse: 74 71 63 67  Resp: 19 19 17 20   Temp: 98.6 F (37 C) 97.9 F (36.6 C) 98.5 F (36.9 C)   TempSrc: Oral Oral Oral   SpO2: 98% 95% 97% 95%  Weight:      Height:       Weight change:   Intake/Output Summary (Last 24 hours) at 06/06/2020 0833 Last data filed at  06/06/2020 0710 Gross per 24 hour  Intake --  Output 2075 ml  Net -2075 ml       Labs: Basic Metabolic Panel: Recent Labs  Lab 06/05/20 0353 06/05/20 0606 06/05/20 1505 06/06/20 0121  NA 141 142 143 141  K 5.6* 5.8* 5.4* 6.1*  CL 112* 111 106 109  CO2 20* 21* 27 25  GLUCOSE 139* 155* 110* 166*  BUN 66* 62* 51* 44*  CREATININE 2.13* 2.12* 1.93* 1.90*  CALCIUM 8.5* 8.2* 8.3* 7.5*  PHOS 2.6 2.4* 2.4*  --    Liver Function Tests: Recent Labs  Lab 06/04/20 1219 06/04/20 2200 06/05/20 1505  AST 18 20  --   ALT 26 22  --   ALKPHOS 61 55  --   BILITOT 0.5 0.6  --   PROT 7.3 6.1*  --   ALBUMIN 3.9 3.4* 3.2*   Recent Labs  Lab 06/04/20 1219  LIPASE 114*   No results for input(s): AMMONIA in the last 168 hours. CBC: Recent Labs  Lab 06/04/20 1219 06/06/20 0121  WBC 7.6 6.4  NEUTROABS  --  4.2  HGB 13.2 10.7*  HCT 40.0 32.0*  MCV 97.6 95.2  PLT 295 207   Cardiac Enzymes: No results for input(s): CKTOTAL, CKMB, CKMBINDEX, TROPONINI in the last 168 hours. CBG: Recent Labs  Lab 06/05/20 2014 06/05/20 2336 06/06/20 0042 06/06/20 0404 06/06/20 0719  GLUCAP 219* 67* 168* 88 93    Iron Studies: No results for input(s): IRON, TIBC, TRANSFERRIN,  FERRITIN in the last 72 hours. Studies/Results: CT ABDOMEN PELVIS WO CONTRAST  Result Date: 06/04/2020 CLINICAL DATA:  Vomiting and diarrhea EXAM: CT ABDOMEN AND PELVIS WITHOUT CONTRAST TECHNIQUE: Multidetector CT imaging of the abdomen and pelvis was performed following the standard protocol without IV contrast. COMPARISON:  12/13/2019 FINDINGS: Lower chest: No acute abnormality. Hepatobiliary: Few punctate calcified granulomas. Gallbladder is unremarkable. No biliary dilatation. Pancreas: Unremarkable. Spleen: Unremarkable. Adrenals/Urinary Tract: Adrenals are unremarkable. Atrophic right kidney unchanged partially calcified upper pole cyst and nonobstructing upper pole renal calculus. Left renal cysts unchanged.  Bladder is unremarkable. Stomach/Bowel: Stomach is within normal limits. Bowel is normal in caliber. Normal appendix. Vascular/Lymphatic: Aortic atherosclerosis. Some intimal calcification is displaced relative to the outer wall and may reflect chronic dissection. This is unchanged. No enlarged lymph nodes. Reproductive: Stable appearance of prostate. Other: No ascites.  No acute abnormality of the abdominal wall. Musculoskeletal: No acute osseous abnormality. Lumbar degenerative changes are greatest at L5-S1. IMPRESSION: No acute abnormality. 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 Electronically Signed   By: Guadlupe Spanish M.D.   On: 06/04/2020 15:02   CT Head Wo Contrast  Result Date: 06/04/2020 CLINICAL DATA:  Mental status change, unknown cause. Additional history provided: Confusion for 5 weeks, mental status change, vomiting, diarrhea, no appetite. EXAM: CT HEAD WITHOUT CONTRAST TECHNIQUE: Contiguous axial images were obtained from the base of the skull through the vertex without intravenous contrast. COMPARISON:  Brain MRI 05/30/2017. Head CT 05/30/2017. FINDINGS: Brain: Mildly motion degraded exam. Mild cerebral atrophy. 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 Background mild ill-defined hypoattenuation within the cerebral white matter is nonspecific, but compatible with chronic small vessel ischemic disease. There is no acute intracranial hemorrhage. No demarcated cortical infarct. No extra-axial fluid collection. No evidence of intracranial mass. No midline shift. Vascular: No hyperdense vessel.  Atherosclerotic calcifications Skull: Mildly motion degraded exam. Sinuses/Orbits: Visualized orbits show no acute finding. No significant paranasal sinus disease at the imaged levels. IMPRESSION: Mildly motion degraded exam. No evidence of acute intracranial abnormality. Redemonstrated chronic small-vessel infarcts within the left  corona radiata and right basal ganglia. Stable background mild generalized atrophy and chronic small vessel ischemic disease. Electronically Signed   By: Jackey Loge DO   On: 06/04/2020 14:59   DG Chest Portable 1 View  Result Date: 06/04/2020 CLINICAL DATA:  Altered mental status EXAM: PORTABLE CHEST 1 VIEW COMPARISON:  December 13, 2019 FINDINGS: Lungs are clear. Heart size and pulmonary vascularity are normal. No adenopathy. There is aortic atherosclerosis. No bone lesions. IMPRESSION: Lungs clear.  Cardiac silhouette normal. Aortic Atherosclerosis (ICD10-I70.0). Electronically Signed   By: Bretta Bang III M.D.   On: 06/04/2020 14:08    Medications: Infusions: . sodium chloride Stopped (06/04/20 1714)    Scheduled Medications: . Chlorhexidine Gluconate Cloth  6 each Topical Daily  . feeding supplement  237 mL Oral TID BM  . furosemide  40 mg Intravenous Once  . heparin  5,000 Units Subcutaneous Q8H  . insulin aspart  0-9 Units Subcutaneous Q4H  . sodium zirconium cyclosilicate  10 g Oral BID    have reviewed scheduled and prn medications.  Physical Exam: General:NAD, comfortable Heart:RRR, s1s2 nl Lungs:clear b/l, no crackle Abdomen:soft, Non-tender, non-distended Extremities:No edema Neurology: Alert awake, nonfocal.  Maxie Barb 06/06/2020,8:33 AM  LOS: 1 day  Pager: [phone]

## 2020-06-07 DIAGNOSIS — E875 Hyperkalemia: Secondary | ICD-10-CM | POA: Diagnosis not present

## 2020-06-07 LAB — GLUCOSE, CAPILLARY
Glucose-Capillary: 91 mg/dL (ref 70–99)
Glucose-Capillary: 89 mg/dL (ref 70–99)
Glucose-Capillary: 83 mg/dL (ref 70–99)
Glucose-Capillary: 97 mg/dL (ref 70–99)
Glucose-Capillary: 167 mg/dL — ABNORMAL HIGH (ref 70–99)

## 2020-06-07 LAB — CBC
HCT: 35.6 % — ABNORMAL LOW (ref 39.0–52.0)
Hemoglobin: 12.1 g/dL — ABNORMAL LOW (ref 13.0–17.0)
MCH: 32.5 pg (ref 26.0–34.0)
MCHC: 34 g/dL (ref 30.0–36.0)
MCV: 95.7 fL (ref 80.0–100.0)
Platelets: 220 10*3/uL (ref 150–400)
RBC: 3.72 MIL/uL — ABNORMAL LOW (ref 4.22–5.81)
RDW: 12.3 % (ref 11.5–15.5)
WBC: 7.2 10*3/uL (ref 4.0–10.5)
nRBC: 0 % (ref 0.0–0.2)

## 2020-06-07 LAB — BASIC METABOLIC PANEL
Anion gap: 7 (ref 5–15)
BUN: 33 mg/dL — ABNORMAL HIGH (ref 8–23)
CO2: 26 mmol/L (ref 22–32)
Calcium: 7.9 mg/dL — ABNORMAL LOW (ref 8.9–10.3)
Chloride: 107 mmol/L (ref 98–111)
Creatinine, Ser: 1.93 mg/dL — ABNORMAL HIGH (ref 0.61–1.24)
GFR, Estimated: 35 mL/min — ABNORMAL LOW (ref >60)
Glucose, Bld: 104 mg/dL — ABNORMAL HIGH (ref 70–99)
Potassium: 5.8 mmol/L — ABNORMAL HIGH (ref 3.5–5.1)
Sodium: 140 mmol/L (ref 135–145)

## 2020-06-07 MED ORDER — FUROSEMIDE 10 MG/ML IJ SOLN
40.0000 mg | INTRAMUSCULAR | Status: AC
  Administered 2020-06-07: 40 mg via INTRAVENOUS
  Filled 2020-06-07: qty 4

## 2020-06-07 MED ORDER — SODIUM CHLORIDE 0.9 % IV SOLN: INTRAVENOUS | Status: AC

## 2020-06-07 MED ORDER — CARBAMIDE PEROXIDE 6.5 % OT SOLN
5.0000 [drp] | Freq: Two times a day (BID) | OTIC | Status: DC
  Administered 2020-06-07 – 2020-06-10 (×8): 5 [drp] via OTIC
  Filled 2020-06-07: qty 15

## 2020-06-07 MED ORDER — SODIUM ZIRCONIUM CYCLOSILICATE 10 G PO PACK
10.0000 g | PACK | Freq: Two times a day (BID) | ORAL | Status: AC
  Administered 2020-06-07 (×2): 10 g via ORAL
  Filled 2020-06-07 (×2): qty 1

## 2020-06-07 MED ORDER — FUROSEMIDE 10 MG/ML IJ SOLN: 40.0000 mg | Freq: Once | INTRAMUSCULAR | Status: DC

## 2020-06-07 MED ORDER — CARBAMIDE PEROXIDE 6.5 % OT SOLN
5.0000 [drp] | Freq: Two times a day (BID) | OTIC | Status: DC
  Filled 2020-06-07: qty 15

## 2020-06-07 NOTE — Progress Notes (Signed)
Progress Note    Steven Chang  [name]:[id number] DOB: 1941/05/30  DOA: 06/04/2020 PCP: Leola Brazil, DO    Brief Narrative:     Medical records reviewed and are as summarized below:  Steven Chang is an 79 y.o. male with history of hypertension, diabetes, chronic kidney disease stage IIIb with a creatinine of two.  He presented with nausea and vomiting and was found to have hyperkalemia and worsening renal failure.  Assessment/Plan:   Principal Problem:   Hyperkalemia Active Problems:   AKI (acute kidney injury) (HCC)   DM2 (diabetes mellitus, type 2) (HCC)   HTN (hypertension)   Malnutrition of moderate degree   Severe hyperkalemia: -DC lisinopril -Nephrology consult -IV Lasix, Lokelma -daily labs  Acute kidney injury on chronic kidney disease 3B: -Holding both Lasix and lisinopril -Improved with IV fluids  Reported diarrhea -resolved  Hypertension -Restart low-dose beta-blocker  Overweight Body mass index is 25.78 kg/m.  Left ear wax impaction -debrox  Family Communication/Anticipated D/C date and plan/Code Status   DVT prophylaxis: heparin Code Status: Full Code.  Disposition Plan: Status is: Inpatient  Remains inpatient appropriate because:Inpatient level of care appropriate due to severity of illness   Dispo: The patient is from: Home              Anticipated d/c is to: Home              Anticipated d/c date is: > 3 days              Patient currently is not medically stable to d/c.         Medical Consultants:    renal  Subjective:   C/o fullness in left ear  Objective:    Vitals:   06/07/20 0305 06/07/20 0628 06/07/20 0750 06/07/20 0829  BP: 123/71  99/63 118/61  Pulse: 68  68   Resp: 20 12 14    Temp: 98.3 F (36.8 C)  98.6 F (37 C)   TempSrc: Oral  Oral   SpO2: 95%  92%   Weight:      Height:        Intake/Output Summary (Last 24 hours) at 06/07/2020 1043 Last data filed at 06/07/2020 1036 Gross per 24 hour   Intake 857.71 ml  Output 1950 ml  Net -1092.29 ml   Filed Weights   06/04/20 1209 06/05/20 0210  Weight: 79.4 kg 76.9 kg    Exam:  General: Appearance:     Overweight male in no acute distress   Left ear wax impaction-- unable to see ear drum  Lungs:     diminished, respirations unlabored  Heart:    Normal heart rate. Normal rhythm. No murmurs, rubs, or gallops.   MS:   All extremities are intact.   Neurologic:   Awake, alert, oriented x 3.      Data Reviewed:   I have personally reviewed following labs and imaging studies:  Labs: Labs show the following:   Basic Metabolic Panel: Recent Labs  Lab 06/04/20 1459 06/04/20 1609 06/05/20 0353 06/05/20 0606 06/05/20 1505 06/06/20 0121 06/07/20 0033  NA 139   < > 141 142 143 141 140  K 7.4*   < > 5.6* 5.8* 5.4* 6.1* 5.8*  CL 114*   < > 112* 111 106 109 107  CO2 17*   < > 20* 21* 27 25 26   GLUCOSE 117*   < > 139* 155* 110* 166* 104*  BUN 92*   < >  66* 62* 51* 44* 33*  CREATININE 2.63*   < > 2.13* 2.12* 1.93* 1.90* 1.93*  CALCIUM 8.4*   < > 8.5* 8.2* 8.3* 7.5* 7.9*  MG 1.2*  --   --  1.5*  --  2.0  --   PHOS  --   --  2.6 2.4* 2.4*  --   --    < > = values in this interval not displayed.   GFR Estimated Creatinine Clearance: 30.5 mL/min (A) (by C-G formula based on SCr of 1.93 mg/dL (H)). Liver Function Tests: Recent Labs  Lab 06/04/20 1219 06/04/20 2200 06/05/20 1505  AST 18 20  --   ALT 26 22  --   ALKPHOS 61 55  --   BILITOT 0.5 0.6  --   PROT 7.3 6.1*  --   ALBUMIN 3.9 3.4* 3.2*   Recent Labs  Lab 06/04/20 1219  LIPASE 114*   No results for input(s): AMMONIA in the last 168 hours. Coagulation profile No results for input(s): INR, PROTIME in the last 168 hours.  CBC: Recent Labs  Lab 06/04/20 1219 06/06/20 0121 06/07/20 0033  WBC 7.6 6.4 7.2  NEUTROABS  --  4.2  --   HGB 13.2 10.7* 12.1*  HCT 40.0 32.0* 35.6*  MCV 97.6 95.2 95.7  PLT 295 207 220   Cardiac Enzymes: No results for  input(s): CKTOTAL, CKMB, CKMBINDEX, TROPONINI in the last 168 hours. BNP (last 3 results) No results for input(s): PROBNP in the last 8760 hours. CBG: Recent Labs  Lab 06/06/20 1552 06/06/20 1955 06/06/20 2316 06/07/20 0321 06/07/20 0750  GLUCAP 102* 143* 104* 91 89   D-Dimer: No results for input(s): DDIMER in the last 72 hours. Hgb A1c: Recent Labs    06/05/20 0353  HGBA1C 7.1*   Lipid Profile: No results for input(s): CHOL, HDL, LDLCALC, TRIG, CHOLHDL, LDLDIRECT in the last 72 hours. Thyroid function studies: No results for input(s): TSH, T4TOTAL, T3FREE, THYROIDAB in the last 72 hours.  Invalid input(s): FREET3 Anemia work up: No results for input(s): VITAMINB12, FOLATE, FERRITIN, TIBC, IRON, RETICCTPCT in the last 72 hours. Sepsis Labs: Recent Labs  Lab 06/04/20 1219 06/06/20 0121 06/07/20 0033  WBC 7.6 6.4 7.2    Microbiology Recent Results (from the past 240 hour(s))  Resp Panel by RT-PCR (Flu A&B, Covid) Nasopharyngeal Swab     Status: None   Collection Time: 06/04/20  1:50 PM   Specimen: Nasopharyngeal Swab; Nasopharyngeal(NP) swabs in vial transport medium  Result Value Ref Range Status   SARS Coronavirus 2 by RT PCR NEGATIVE NEGATIVE Final    Comment: (NOTE) SARS-CoV-2 target nucleic acids are NOT DETECTED.  The SARS-CoV-2 RNA is generally detectable in upper respiratory specimens during the acute phase of infection. The lowest concentration of SARS-CoV-2 viral copies this assay can detect is 138 copies/mL. A negative result does not preclude SARS-Cov-2 infection and should not be used as the sole basis for treatment or other patient management decisions. A negative result may occur with  improper specimen collection/handling, submission of specimen other than nasopharyngeal swab, presence of viral mutation(s) within the areas targeted by this assay, and inadequate number of viral copies(<138 copies/mL). A negative result must be combined  with clinical observations, patient history, and epidemiological information. The expected result is Negative.  Fact Sheet for Patients:  BloggerCourse.com  Fact Sheet for Healthcare Providers:  SeriousBroker.it  This test is no t yet approved or cleared by the [place] FDA and  has been  authorized for detection and/or diagnosis of SARS-CoV-2 by FDA under an Emergency Use Authorization (EUA). This EUA will remain  in effect (meaning this test can be used) for the duration of the COVID-19 declaration under Section 564(b)(1) of the Act, 21 U.S.C.section 360bbb-3(b)(1), unless the authorization is terminated  or revoked sooner.       Influenza A by PCR NEGATIVE NEGATIVE Final   Influenza B by PCR NEGATIVE NEGATIVE Final    Comment: (NOTE) The Xpert Xpress SARS-CoV-2/FLU/RSV plus assay is intended as an aid in the diagnosis of influenza from Nasopharyngeal swab specimens and should not be used as a sole basis for treatment. Nasal washings and aspirates are unacceptable for Xpert Xpress SARS-CoV-2/FLU/RSV testing.  Fact Sheet for Patients: BloggerCourse.comhttps://www.fda.gov/media/152166/download  Fact Sheet for Healthcare Providers: SeriousBroker.ithttps://www.fda.gov/media/152162/download  This test is not yet approved or cleared by the [place] FDA and has been authorized for detection and/or diagnosis of SARS-CoV-2 by FDA under an Emergency Use Authorization (EUA). This EUA will remain in effect (meaning this test can be used) for the duration of the COVID-19 declaration under Section 564(b)(1) of the Act, 21 U.S.C. section 360bbb-3(b)(1), unless the authorization is terminated or revoked.  Performed at [facility], [address] [place], [place] [address]   MRSA PCR Screening     Status: None   Collection Time: 06/05/20  2:12 AM   Specimen: Nasopharyngeal  Result Value Ref Range Status   MRSA by PCR NEGATIVE NEGATIVE Final     Comment:        The GeneXpert MRSA Assay (FDA approved for NASAL specimens only), is one component of a comprehensive MRSA colonization surveillance program. It is not intended to diagnose MRSA infection nor to guide or monitor treatment for MRSA infections. Performed at [facility], [address] [place], [place] [address]   Gastrointestinal Panel by PCR , Stool     Status: None   Collection Time: 06/06/20  7:22 AM   Specimen: Stool  Result Value Ref Range Status   Campylobacter species NOT DETECTED NOT DETECTED Final   Plesimonas shigelloides NOT DETECTED NOT DETECTED Final   Salmonella species NOT DETECTED NOT DETECTED Final   Yersinia enterocolitica NOT DETECTED NOT DETECTED Final   Vibrio species NOT DETECTED NOT DETECTED Final   Vibrio cholerae NOT DETECTED NOT DETECTED Final   Enteroaggregative E coli (EAEC) NOT DETECTED NOT DETECTED Final   Enteropathogenic E coli (EPEC) NOT DETECTED NOT DETECTED Final   Enterotoxigenic E coli (ETEC) NOT DETECTED NOT DETECTED Final   Shiga like toxin producing E coli (STEC) NOT DETECTED NOT DETECTED Final   Shigella/Enteroinvasive E coli (EIEC) NOT DETECTED NOT DETECTED Final   Cryptosporidium NOT DETECTED NOT DETECTED Final   Cyclospora cayetanensis NOT DETECTED NOT DETECTED Final   Entamoeba histolytica NOT DETECTED NOT DETECTED Final   Giardia lamblia NOT DETECTED NOT DETECTED Final   Adenovirus F40/41 NOT DETECTED NOT DETECTED Final   Astrovirus NOT DETECTED NOT DETECTED Final   Norovirus GI/GII NOT DETECTED NOT DETECTED Final   Rotavirus A NOT DETECTED NOT DETECTED Final   Sapovirus (I, II, IV, and V) NOT DETECTED NOT DETECTED Final    Comment: Performed at [facility], [address] [place], [place] [address]  C Difficile Quick Screen (NO PCR Reflex)     Status: None   Collection Time: 06/06/20  7:22 AM   Specimen: STOOL  Result Value Ref Range Status   C Diff antigen NEGATIVE NEGATIVE Final  C Diff toxin  NEGATIVE NEGATIVE Final   C Diff interpretation No C. difficile detected.  Final    Comment: Performed at [facility], [address] [place], [place] [address]    Procedures and diagnostic studies:  No results found.  Medications:   . carbamide peroxide  5 drop Left EAR BID  . Chlorhexidine Gluconate Cloth  6 each Topical Daily  . feeding supplement (NEPRO CARB STEADY)  237 mL Oral BID BM  . heparin  5,000 Units Subcutaneous Q8H  . insulin aspart  0-9 Units Subcutaneous Q4H  . metoprolol succinate  12.5 mg Oral Daily  . sodium zirconium cyclosilicate  10 g Oral BID  . umeclidinium-vilanterol  1 puff Inhalation Daily   Continuous Infusions: . sodium chloride Stopped (06/04/20 1714)  . sodium chloride 100 mL/hr at 06/07/20 0825     LOS: 2 days   Joseph Art  [facility]   How to contact the [facility] Attending or Consulting provider 7A - 7P or covering provider during after hours 7P -7A, for this patient?  1. Check the care team in [facility] and look for a) attending/consulting [facility] provider listed and b) the [facility] team listed 2. Log into [email or web address] and use [place]'s universal password to access. If you do not have the password, please contact the hospital operator. 3. Locate the [facility] provider you are looking for under [facility] and page to a number that you can be directly reached. 4. If you still have difficulty reaching the provider, please page the [facility] (Director on Call) for the Hospitalists listed on amion for assistance.  06/07/2020, 10:43 AM

## 2020-06-07 NOTE — Progress Notes (Signed)
[facility] NEPHROLOGY PROGRESS NOTE  Assessment/ Plan: Pt is a 79 y.o. 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 Janit Pagan presented with around 5 days of nausea vomiting, decreased oral intake, diarrhea, seen as a consultation for the evaluation of hyperkalemia and worsening renal failure.  #Severe hyperkalemia due to worsening renal failure concomitant with the use of lisinopril.  -Received medical treatment for hyperkalemia with improvement of potassium initially. Potassium is still up at 5.8 today.  I will order another dose of Lasix with IVF and Lokelma today.  Continue to hold lisinopril.  Monitor lab.   #Acute kidney injury on CKD 3b hemodynamically mediated in the setting of severe dehydration concomitant with the use of diuretics and ACE inhibitor.  Urinalysis bland and a CT scan ruled out obstruction.  The creatinine level improved to his baseline. I recommend to hold lisinopril but can resume oral Lasix on discharge.   #Metabolic acidosis: Improved with IV bicarbonate.    #Hypertension: Blood pressure soft off antihypertensive.  Monitor BP.  #Nausea vomiting and diarrhea: CT scan of abdomen pelvis without acute finding.  Clinically improved with IV hydration.  Subjective: Seen and examined.  Urine output 2.1 L.  Denies nausea vomiting chest pain shortness of breath.  Some diarrhea this morning. Objective Vital signs in last 24 hours: Vitals:   06/06/20 2318 06/07/20 0305 06/07/20 0628 06/07/20 0750  BP: 118/65 123/71  99/63  Pulse: 65 68  68  Resp: 18 20 12 14   Temp: 98.5 F (36.9 C) 98.3 F (36.8 C)  98.6 F (37 C)  TempSrc: Oral Oral  Oral  SpO2: 93% 95%  92%  Weight:      Height:       Weight change:   Intake/Output Summary (Last 24 hours) at 06/07/2020 0811 Last data filed at 06/07/2020 0300 Gross per 24 hour  Intake 1097.71 ml  Output 1700 ml  Net  -602.29 ml       Labs: Basic Metabolic Panel: Recent Labs  Lab 06/05/20 0353 06/05/20 0606 06/05/20 1505 06/06/20 0121 06/07/20 0033  NA 141 142 143 141 140  K 5.6* 5.8* 5.4* 6.1* 5.8*  CL 112* 111 106 109 107  CO2 20* 21* 27 25 26   GLUCOSE 139* 155* 110* 166* 104*  BUN 66* 62* 51* 44* 33*  CREATININE 2.13* 2.12* 1.93* 1.90* 1.93*  CALCIUM 8.5* 8.2* 8.3* 7.5* 7.9*  PHOS 2.6 2.4* 2.4*  --   --    Liver Function Tests: Recent Labs  Lab 06/04/20 1219 06/04/20 2200 06/05/20 1505  AST 18 20  --   ALT 26 22  --   ALKPHOS 61 55  --   BILITOT 0.5 0.6  --   PROT 7.3 6.1*  --   ALBUMIN 3.9 3.4* 3.2*   Recent Labs  Lab 06/04/20 1219  LIPASE 114*   No results for input(s): AMMONIA in the last 168 hours. CBC: Recent Labs  Lab 06/04/20 1219 06/06/20 0121 06/07/20 0033  WBC 7.6 6.4 7.2  NEUTROABS  --  4.2  --   HGB 13.2 10.7* 12.1*  HCT 40.0 32.0* 35.6*  MCV 97.6 95.2 95.7  PLT 295 207 220   Cardiac Enzymes: No results for input(s): CKTOTAL, CKMB, CKMBINDEX, TROPONINI in the last 168 hours. CBG: Recent Labs  Lab 06/06/20 1552 06/06/20 1955 06/06/20 2316 06/07/20 0321 06/07/20 0750  GLUCAP 102* 143* 104* 91 89  Iron Studies: No results for input(s): IRON, TIBC, TRANSFERRIN, FERRITIN in the last 72 hours. Studies/Results: No results found.  Medications: Infusions: . sodium chloride Stopped (06/04/20 1714)    Scheduled Medications: . Chlorhexidine Gluconate Cloth  6 each Topical Daily  . feeding supplement (NEPRO CARB STEADY)  237 mL Oral BID BM  . furosemide  40 mg Intravenous STAT  . heparin  5,000 Units Subcutaneous Q8H  . insulin aspart  0-9 Units Subcutaneous Q4H  . metoprolol succinate  12.5 mg Oral Daily  . sodium zirconium cyclosilicate  10 g Oral BID  . umeclidinium-vilanterol  1 puff Inhalation Daily    have reviewed scheduled and prn medications.  Physical Exam: General:NAD, comfortable Heart:RRR, s1s2 nl Lungs:clear b/l, no  crackle Abdomen:soft, Non-tender, non-distended Extremities:No edema Neurology: Alert awake, nonfocal.  Maxie Barb 06/07/2020,8:11 AM  LOS: 2 days  Pager: [phone]

## 2020-06-07 NOTE — Progress Notes (Signed)
Physical Therapy Treatment Patient Details Name: Steven Chang MRN: [id number] DOB: [birth year] Today's Date: 06/07/2020    History of Present Illness 79 y.o. male with medical history significant of CKD 3b with baseline creat of ~1.9-2, DM2, HTN, COPD. Presents to ED12/27 with AMS, periods of emesis and diarrhea over the past 5 weeks. Admitted for kyperkalemia, and AKI    PT Comments    Pt supine in bed. Pt eager to mobilize this session.  Daughter from [place] present at bedside.  Pt particpated in progression of gt training, LE exercises and stair training.  Continue to recommend return home with HHPT.  Daughter given gt belt for home use.    Follow Up Recommendations  Home health PT;Supervision/Assistance - 24 hour     Equipment Recommendations  Rolling walker with 5" wheels    Recommendations for Other Services       Precautions / Restrictions Precautions Precautions: None Restrictions Weight Bearing Restrictions: No    Mobility  Bed Mobility Overal bed mobility: Needs Assistance Bed Mobility: Supine to Sit;Sit to Supine     Supine to sit: Supervision;HOB elevated     General bed mobility comments: increased time and effort to come to EoB  Transfers Overall transfer level: Needs assistance Equipment used: None Transfers: Sit to/from Stand Sit to Stand: Min guard         General transfer comment: min guard for safety, good power up and self steadying with increased time  Ambulation/Gait Ambulation/Gait assistance: Min guard Gait Distance (Feet): 100 Feet Assistive device: None Gait Pattern/deviations: Step-through pattern;Decreased step length - right;Decreased step length - left;Shuffle Gait velocity: slowed   General Gait Details: contact guard assist for mild unsteadiness, no overt LoB, reports feeling swimmy headed but as gt progressed reports symptoms diminshed.   Stairs Stairs: Yes Stairs assistance: Min assist Stair Management: One rail Left  (with HHA on the R) Number of Stairs: 5 General stair comments: Cues for sequencing and min assistance to steady balance.   Wheelchair Mobility    Modified Rankin (Stroke Patients Only)       Balance Overall balance assessment: Needs assistance Sitting-balance support: No upper extremity supported;Feet supported Sitting balance-Leahy Scale: Fair       Standing balance-Leahy Scale: Fair                              Cognition Arousal/Alertness: Awake/alert Behavior During Therapy: WFL for tasks assessed/performed Overall Cognitive Status: Within Functional Limits for tasks assessed                                        Exercises General Exercises - Lower Extremity Ankle Circles/Pumps: AROM;Both;10 reps;Supine Quad Sets: AROM;Both;10 reps;Supine Long Arc Quad: AROM;Both;10 reps;Seated Hip ABduction/ADduction: AROM;Both;10 reps;Supine Straight Leg Raises: AROM;Both;10 reps;Supine Hip Flexion/Marching: AROM;Both;10 reps;Seated    General Comments        Pertinent Vitals/Pain      Home Living                      Prior Function            PT Goals (current goals can now be found in the care plan section) Acute Rehab PT Goals Patient Stated Goal: feel better Potential to Achieve Goals: Good Progress towards PT goals: Progressing toward goals    Frequency  Min 3X/week      PT Plan Current plan remains appropriate    Co-evaluation              AM-PAC PT "6 Clicks" Mobility   Outcome Measure  Help needed turning from your back to your side while in a flat bed without using bedrails?: A Little Help needed moving from lying on your back to sitting on the side of a flat bed without using bedrails?: A Little Help needed moving to and from a bed to a chair (including a wheelchair)?: A Little Help needed standing up from a chair using your arms (e.g., wheelchair or bedside chair)?: A Little Help needed to walk  in hospital room?: A Little Help needed climbing 3-5 steps with a railing? : A Little 6 Click Score: 18    End of Session Equipment Utilized During Treatment: Gait belt Activity Tolerance: Patient tolerated treatment well Patient left: in bed;with call bell/phone within reach;with bed alarm set Nurse Communication: Mobility status PT Visit Diagnosis: Unsteadiness on feet (R26.81);Other abnormalities of gait and mobility (R26.89);Muscle weakness (generalized) (M62.81)     Time: 9675-9163 PT Time Calculation (min) (ACUTE ONLY): 27 min  Charges:  $Gait Training: 8-22 mins $Therapeutic Exercise: 8-22 mins                     Bonney Leitz , PTA Acute Rehabilitation Services Pager [phone] Office [phone]     Rakeisha Nyce Artis Delay 06/07/2020, 3:27 PM

## 2020-06-08 DIAGNOSIS — E875 Hyperkalemia: Secondary | ICD-10-CM | POA: Diagnosis not present

## 2020-06-08 LAB — CBC
HCT: 36.2 % — ABNORMAL LOW (ref 39.0–52.0)
Hemoglobin: 11.6 g/dL — ABNORMAL LOW (ref 13.0–17.0)
MCH: 30.8 pg (ref 26.0–34.0)
MCHC: 32 g/dL (ref 30.0–36.0)
MCV: 96 fL (ref 80.0–100.0)
Platelets: 214 10*3/uL (ref 150–400)
RBC: 3.77 MIL/uL — ABNORMAL LOW (ref 4.22–5.81)
RDW: 12.1 % (ref 11.5–15.5)
WBC: 5.9 10*3/uL (ref 4.0–10.5)
nRBC: 0 % (ref 0.0–0.2)

## 2020-06-08 LAB — BASIC METABOLIC PANEL
Anion gap: 9 (ref 5–15)
BUN: 26 mg/dL — ABNORMAL HIGH (ref 8–23)
CO2: 25 mmol/L (ref 22–32)
Calcium: 7.9 mg/dL — ABNORMAL LOW (ref 8.9–10.3)
Chloride: 107 mmol/L (ref 98–111)
Creatinine, Ser: 1.98 mg/dL — ABNORMAL HIGH (ref 0.61–1.24)
GFR, Estimated: 34 mL/min — ABNORMAL LOW (ref >60)
Glucose, Bld: 107 mg/dL — ABNORMAL HIGH (ref 70–99)
Potassium: 5.1 mmol/L (ref 3.5–5.1)
Sodium: 141 mmol/L (ref 135–145)

## 2020-06-08 LAB — GLUCOSE, CAPILLARY
Glucose-Capillary: 104 mg/dL — ABNORMAL HIGH (ref 70–99)
Glucose-Capillary: 111 mg/dL — ABNORMAL HIGH (ref 70–99)
Glucose-Capillary: 151 mg/dL — ABNORMAL HIGH (ref 70–99)
Glucose-Capillary: 72 mg/dL (ref 70–99)
Glucose-Capillary: 75 mg/dL (ref 70–99)
Glucose-Capillary: 87 mg/dL (ref 70–99)
Glucose-Capillary: 93 mg/dL (ref 70–99)

## 2020-06-08 MED ORDER — SODIUM ZIRCONIUM CYCLOSILICATE 10 G PO PACK
10.0000 g | PACK | Freq: Every day | ORAL | Status: AC
  Administered 2020-06-08 – 2020-06-10 (×3): 10 g via ORAL
  Filled 2020-06-08 (×4): qty 1

## 2020-06-08 MED ORDER — LOPERAMIDE HCL 2 MG PO CAPS
2.0000 mg | ORAL_CAPSULE | ORAL | Status: DC | PRN
  Administered 2020-06-09: 2 mg via ORAL
  Filled 2020-06-08: qty 1

## 2020-06-08 MED ORDER — FUROSEMIDE 40 MG PO TABS
40.0000 mg | ORAL_TABLET | Freq: Every day | ORAL | Status: DC
  Administered 2020-06-08 – 2020-06-11 (×4): 40 mg via ORAL
  Filled 2020-06-08 (×5): qty 1

## 2020-06-08 NOTE — Plan of Care (Signed)

## 2020-06-08 NOTE — Progress Notes (Signed)
[facility] NEPHROLOGY PROGRESS NOTE  Assessment/ Plan: Pt is a 79 y.o. 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 Janit Pagan presented with around 5 days of nausea vomiting, decreased oral intake, diarrhea, seen as a consultation for the evaluation of hyperkalemia and AKI on CKD  #Severe hyperkalemia: 2/2 AKI and Acei use. Has improved with medical treatment -K improved to 5.1 today w/ IV lasix and lokelma yesterday -Transition to oral lasix 40mg  daily -Lokelma 10g x3 days -Goal K <5.5 -Encourage oral hydration w/ lasix   #Acute kidney injury on CKD 3b: 2/2 acei and dehydration. Back to BL of 2. Cont to hold lisinopril   #Metabolic acidosis: Improved with IV bicarbonate. No bicarb needed currently  #Hypertension: Fluctuating Bps. Treatment appropriate currently  #Nausea vomiting and diarrhea: CT scan of abdomen pelvis without acute finding. Continues to have diarrhea. W/u per primary  Subjective: Overall feels well today except for diarrhea after he eats.  Denies palpitations or chest pain.  Objective Vital signs in last 24 hours: Vitals:   06/08/20 0400 06/08/20 0500 06/08/20 0600 06/08/20 0721  BP:    111/67  Pulse: 72 66 67   Resp: 19 17 17 20   Temp:    98.7 F (37.1 C)  TempSrc:    Oral  SpO2: 94% 93% 93% 93%  Weight:      Height:       Weight change:   Intake/Output Summary (Last 24 hours) at 06/08/2020 0929 Last data filed at 06/08/2020 0340 Gross per 24 hour  Intake 1353.29 ml  Output 1150 ml  Net 203.29 ml       Labs: Basic Metabolic Panel: Recent Labs  Lab 06/05/20 0353 06/05/20 0606 06/05/20 1505 06/06/20 0121 06/07/20 0033 06/08/20 0045  NA 141 142 143 141 140 141  K 5.6* 5.8* 5.4* 6.1* 5.8* 5.1  CL 112* 111 106 109 107 107  CO2 20* 21* 27 25 26 25   GLUCOSE 139* 155* 110* 166* 104* 107*  BUN 66* 62* 51* 44* 33* 26*  CREATININE 2.13*  2.12* 1.93* 1.90* 1.93* 1.98*  CALCIUM 8.5* 8.2* 8.3* 7.5* 7.9* 7.9*  PHOS 2.6 2.4* 2.4*  --   --   --    Liver Function Tests: Recent Labs  Lab 06/04/20 1219 06/04/20 2200 06/05/20 1505  AST 18 20  --   ALT 26 22  --   ALKPHOS 61 55  --   BILITOT 0.5 0.6  --   PROT 7.3 6.1*  --   ALBUMIN 3.9 3.4* 3.2*   Recent Labs  Lab 06/04/20 1219  LIPASE 114*   No results for input(s): AMMONIA in the last 168 hours. CBC: Recent Labs  Lab 06/04/20 1219 06/06/20 0121 06/07/20 0033 06/08/20 0045  WBC 7.6 6.4 7.2 5.9  NEUTROABS  --  4.2  --   --   HGB 13.2 10.7* 12.1* 11.6*  HCT 40.0 32.0* 35.6* 36.2*  MCV 97.6 95.2 95.7 96.0  PLT 295 207 220 214   Cardiac Enzymes: No results for input(s): CKTOTAL, CKMB, CKMBINDEX, TROPONINI in the last 168 hours. CBG: Recent Labs  Lab 06/07/20 1937 06/08/20 0001 06/08/20 0337 06/08/20 0758 06/08/20 0819  GLUCAP 167* 72 104* 75 93    Iron Studies: No results for input(s): IRON, TIBC, TRANSFERRIN, FERRITIN in the last 72 hours. Studies/Results: No results found.  Medications: Infusions: . sodium chloride Stopped (06/04/20 1714)  Scheduled Medications: . carbamide peroxide  5 drop Left EAR BID  . Chlorhexidine Gluconate Cloth  6 each Topical Daily  . feeding supplement (NEPRO CARB STEADY)  237 mL Oral BID BM  . furosemide  40 mg Oral Daily  . heparin  5,000 Units Subcutaneous Q8H  . insulin aspart  0-9 Units Subcutaneous Q4H  . metoprolol succinate  12.5 mg Oral Daily  . sodium zirconium cyclosilicate  10 g Oral Daily  . umeclidinium-vilanterol  1 puff Inhalation Daily    have reviewed scheduled and prn medications.  Physical Exam: General:NAD, comfortable, sitting in bed Heart: Normal rate, no murmurs Lungs: Bilateral chest rise, no increased work of breathing Abdomen:soft, Non-tender, non-distended Extremities:No edema Neurology: Alert awake, nonfocal.  Bernestine Amass Maurio Baize 06/08/2020,9:29 AM  LOS: 3 days  Pager:  [phone]

## 2020-06-08 NOTE — Progress Notes (Signed)
Progress Note    Steven Chang  KGY:[id number] DOB: [birth year]  DOA: 06/04/2020 PCP: Leola Brazil, DO    Brief Narrative:     Medical records reviewed and are as summarized below:  Steven Chang is an 79 y.o. male with history of hypertension, diabetes, chronic kidney disease stage IIIb with a creatinine of two.  He presented with nausea and vomiting and was found to have hyperkalemia and worsening renal failure.  Assessment/Plan:   Principal Problem:   Hyperkalemia Active Problems:   AKI (acute kidney injury) (HCC)   DM2 (diabetes mellitus, type 2) (HCC)   HTN (hypertension)   Malnutrition of moderate degree   Severe hyperkalemia: -DC lisinopril -Nephrology consult -IV Lasix, Lokelma -daily labs  Acute kidney injury on chronic kidney disease 3B: -Holding lisinopril -Improved with IV fluids-have been DC'd and patient has been restarted on Lasix  Reported diarrhea -Patient is a difficult historian but appears to be postprandial -Patient has seen Tri Elie Confer, MD of [facility] in the past so will defer further work-up to outpatient and [facility] -Infectious work-up negative so can do trial of Imodium  Hypertension -Restart low-dose beta-blocker  Overweight Body mass index is 25.78 kg/m.  Left ear wax impaction -debrox  Family Communication/Anticipated D/C date and plan/Code Status   DVT prophylaxis: heparin Code Status: Full Code.  Disposition Plan: Status is: Inpatient  Remains inpatient appropriate because:Inpatient level of care appropriate due to severity of illness   Dispo: The patient is from: Home              Anticipated d/c is to: Home              Anticipated d/c date is: 1-2 days              Patient currently is not medically stable to d/c.         Medical Consultants:    renal  Subjective:   C/o fullness in left ear  Objective:    Vitals:   06/08/20 0400 06/08/20 0500 06/08/20 0600 06/08/20 0721  BP:    111/67   Pulse: 72 66 67   Resp: 19 17 17 20   Temp:    98.7 F (37.1 C)  TempSrc:    Oral  SpO2: 94% 93% 93% 93%  Weight:      Height:        Intake/Output Summary (Last 24 hours) at 06/08/2020 1534 Last data filed at 06/08/2020 0340 Gross per 24 hour  Intake 1353.29 ml  Output 750 ml  Net 603.29 ml   Filed Weights   06/04/20 1209 06/05/20 0210  Weight: 79.4 kg 76.9 kg    Exam:  General: Appearance:     Overweight male in no acute distress     Lungs:      respirations unlabored  Heart:    Normal heart rate. Normal rhythm. No murmurs, rubs, or gallops.   MS:   All extremities are intact.   Neurologic:   Awake, alert, oriented x 3.  Mildly hard of hearing                        Data Reviewed:   I have personally reviewed following labs and imaging studies:  Labs: Labs show the following:   Basic Metabolic Panel: Recent Labs  Lab 06/04/20 1459 06/04/20 1609 06/05/20 0353 06/05/20 0606 06/05/20 1505 06/06/20 0121 06/07/20 0033 06/08/20 0045  NA 139   < >  141 142 143 141 140 141  K 7.4*   < > 5.6* 5.8* 5.4* 6.1* 5.8* 5.1  CL 114*   < > 112* 111 106 109 107 107  CO2 17*   < > 20* 21* 27 25 26 25   GLUCOSE 117*   < > 139* 155* 110* 166* 104* 107*  BUN 92*   < > 66* 62* 51* 44* 33* 26*  CREATININE 2.63*   < > 2.13* 2.12* 1.93* 1.90* 1.93* 1.98*  CALCIUM 8.4*   < > 8.5* 8.2* 8.3* 7.5* 7.9* 7.9*  MG 1.2*  --   --  1.5*  --  2.0  --   --   PHOS  --   --  2.6 2.4* 2.4*  --   --   --    < > = values in this interval not displayed.   GFR Estimated Creatinine Clearance: 29.7 mL/min (A) (by C-G formula based on SCr of 1.98 mg/dL (H)). Liver Function Tests: Recent Labs  Lab 06/04/20 1219 06/04/20 2200 06/05/20 1505  AST 18 20  --   ALT 26 22  --   ALKPHOS 61 55  --   BILITOT 0.5 0.6  --   PROT 7.3 6.1*  --   ALBUMIN 3.9 3.4* 3.2*   Recent Labs  Lab 06/04/20 1219  LIPASE 114*   No results for input(s): AMMONIA in the last 168 hours. Coagulation  profile No results for input(s): INR, PROTIME in the last 168 hours.  CBC: Recent Labs  Lab 06/04/20 1219 06/06/20 0121 06/07/20 0033 06/08/20 0045  WBC 7.6 6.4 7.2 5.9  NEUTROABS  --  4.2  --   --   HGB 13.2 10.7* 12.1* 11.6*  HCT 40.0 32.0* 35.6* 36.2*  MCV 97.6 95.2 95.7 96.0  PLT 295 207 220 214   Cardiac Enzymes: No results for input(s): CKTOTAL, CKMB, CKMBINDEX, TROPONINI in the last 168 hours. BNP (last 3 results) No results for input(s): PROBNP in the last 8760 hours. CBG: Recent Labs  Lab 06/08/20 0001 06/08/20 0337 06/08/20 0758 06/08/20 0819 06/08/20 1130  GLUCAP 72 104* 75 93 87   D-Dimer: No results for input(s): DDIMER in the last 72 hours. Hgb A1c: No results for input(s): HGBA1C in the last 72 hours. Lipid Profile: No results for input(s): CHOL, HDL, LDLCALC, TRIG, CHOLHDL, LDLDIRECT in the last 72 hours. Thyroid function studies: No results for input(s): TSH, T4TOTAL, T3FREE, THYROIDAB in the last 72 hours.  Invalid input(s): FREET3 Anemia work up: No results for input(s): VITAMINB12, FOLATE, FERRITIN, TIBC, IRON, RETICCTPCT in the last 72 hours. Sepsis Labs: Recent Labs  Lab 06/04/20 1219 06/06/20 0121 06/07/20 0033 06/08/20 0045  WBC 7.6 6.4 7.2 5.9    Microbiology Recent Results (from the past 240 hour(s))  Resp Panel by RT-PCR (Flu A&B, Covid) Nasopharyngeal Swab     Status: None   Collection Time: 06/04/20  1:50 PM   Specimen: Nasopharyngeal Swab; Nasopharyngeal(NP) swabs in vial transport medium  Result Value Ref Range Status   SARS Coronavirus 2 by RT PCR NEGATIVE NEGATIVE Final    Comment: (NOTE) SARS-CoV-2 target nucleic acids are NOT DETECTED.  The SARS-CoV-2 RNA is generally detectable in upper respiratory specimens during the acute phase of infection. The lowest concentration of SARS-CoV-2 viral copies this assay can detect is 138 copies/mL. A negative result does not preclude SARS-Cov-2 infection and should not be used  as the sole basis for treatment or other patient management decisions. A negative result  may occur with  improper specimen collection/handling, submission of specimen other than nasopharyngeal swab, presence of viral mutation(s) within the areas targeted by this assay, and inadequate number of viral copies(<138 copies/mL). A negative result must be combined with clinical observations, patient history, and epidemiological information. The expected result is Negative.  Fact Sheet for Patients:  BloggerCourse.com  Fact Sheet for Healthcare Providers:  SeriousBroker.it  This test is no t yet approved or cleared by the [place] FDA and  has been authorized for detection and/or diagnosis of SARS-CoV-2 by FDA under an Emergency Use Authorization (EUA). This EUA will remain  in effect (meaning this test can be used) for the duration of the COVID-19 declaration under Section 564(b)(1) of the Act, 21 U.S.C.section 360bbb-3(b)(1), unless the authorization is terminated  or revoked sooner.       Influenza A by PCR NEGATIVE NEGATIVE Final   Influenza B by PCR NEGATIVE NEGATIVE Final    Comment: (NOTE) The Xpert Xpress SARS-CoV-2/FLU/RSV plus assay is intended as an aid in the diagnosis of influenza from Nasopharyngeal swab specimens and should not be used as a sole basis for treatment. Nasal washings and aspirates are unacceptable for Xpert Xpress SARS-CoV-2/FLU/RSV testing.  Fact Sheet for Patients: BloggerCourse.com  Fact Sheet for Healthcare Providers: SeriousBroker.it  This test is not yet approved or cleared by the [place] FDA and has been authorized for detection and/or diagnosis of SARS-CoV-2 by FDA under an Emergency Use Authorization (EUA). This EUA will remain in effect (meaning this test can be used) for the duration of the COVID-19 declaration under Section 564(b)(1)  of the Act, 21 U.S.C. section 360bbb-3(b)(1), unless the authorization is terminated or revoked.  Performed at [facility], [address] [place], [place] [address]   MRSA PCR Screening     Status: None   Collection Time: 06/05/20  2:12 AM   Specimen: Nasopharyngeal  Result Value Ref Range Status   MRSA by PCR NEGATIVE NEGATIVE Final    Comment:        The GeneXpert MRSA Assay (FDA approved for NASAL specimens only), is one component of a comprehensive MRSA colonization surveillance program. It is not intended to diagnose MRSA infection nor to guide or monitor treatment for MRSA infections. Performed at [facility], [address], [place], [place] [address]   Gastrointestinal Panel by PCR , Stool     Status: None   Collection Time: 06/06/20  7:22 AM   Specimen: Stool  Result Value Ref Range Status   Campylobacter species NOT DETECTED NOT DETECTED Final   Plesimonas shigelloides NOT DETECTED NOT DETECTED Final   Salmonella species NOT DETECTED NOT DETECTED Final   Yersinia enterocolitica NOT DETECTED NOT DETECTED Final   Vibrio species NOT DETECTED NOT DETECTED Final   Vibrio cholerae NOT DETECTED NOT DETECTED Final   Enteroaggregative E coli (EAEC) NOT DETECTED NOT DETECTED Final   Enteropathogenic E coli (EPEC) NOT DETECTED NOT DETECTED Final   Enterotoxigenic E coli (ETEC) NOT DETECTED NOT DETECTED Final   Shiga like toxin producing E coli (STEC) NOT DETECTED NOT DETECTED Final   Shigella/Enteroinvasive E coli (EIEC) NOT DETECTED NOT DETECTED Final   Cryptosporidium NOT DETECTED NOT DETECTED Final   Cyclospora cayetanensis NOT DETECTED NOT DETECTED Final   Entamoeba histolytica NOT DETECTED NOT DETECTED Final   Giardia lamblia NOT DETECTED NOT DETECTED Final   Adenovirus F40/41 NOT DETECTED NOT DETECTED Final   Astrovirus NOT DETECTED NOT DETECTED Final  Norovirus GI/GII NOT DETECTED NOT DETECTED Final   Rotavirus A NOT DETECTED NOT DETECTED  Final   Sapovirus (I, II, IV, and V) NOT DETECTED NOT DETECTED Final    Comment: Performed at [facility], [address], [place], [place] [address]  C Difficile Quick Screen (NO PCR Reflex)     Status: None   Collection Time: 06/06/20  7:22 AM   Specimen: STOOL  Result Value Ref Range Status   C Diff antigen NEGATIVE NEGATIVE Final   C Diff toxin NEGATIVE NEGATIVE Final   C Diff interpretation No C. difficile detected.  Final    Comment: Performed at [facility], [address], [place], [place] [address]    Procedures and diagnostic studies:  No results found.  Medications:   . carbamide peroxide  5 drop Left EAR BID  . feeding supplement (NEPRO CARB STEADY)  237 mL Oral BID BM  . furosemide  40 mg Oral Daily  . heparin  5,000 Units Subcutaneous Q8H  . insulin aspart  0-9 Units Subcutaneous Q4H  . metoprolol succinate  12.5 mg Oral Daily  . sodium zirconium cyclosilicate  10 g Oral Daily  . umeclidinium-vilanterol  1 puff Inhalation Daily   Continuous Infusions: . sodium chloride Stopped (06/04/20 1714)     LOS: 3 days   Joseph Art  [facility]   How to contact the [facility] Attending or Consulting provider 7A - 7P or covering provider during after hours 7P -7A, for this patient?  1. Check the care team in [facility] and look for a) attending/consulting TR[facility] provider listed and b) the [facility] team listed 2. Log into [email or web address] and use [place]'s universal password to access. If you do not have the password, please contact the hospital operator. 3. Locate the [facility] provider you are looking for under [facility] and page to a number that you can be directly reached. 4. If you still have difficulty reaching the provider, please page the [facility] (Director on Call) for the Hospitalists listed on amion for assistance.  06/08/2020, 3:34 PM

## 2020-06-08 NOTE — Care Management Important Message (Signed)
Important Message  Patient Details  Name: Steven Chang MRN: [id number] Date of Birth: 1940-10-09   Medicare Important Message Given:  Yes     Dorena Bodo 06/08/2020, 2:38 PM

## 2020-06-08 NOTE — TOC Progression Note (Signed)
Transition of Care [facility]) - Progression Note    Patient Details  Name: Steven Chang MRN: [id number] Date of Birth: May 11, 1941  [facility] CM/SW Contact  Leone Haven, RN Phone Number: 06/08/2020, 4:13 PM  Clinical Narrative:    NCM spoke with patient at bedside, offered choice for HHPT,he states he does not think he needs HHPT.  NCM asked if he needs rolling walker he states he has one at home. NCM informed patient if he changes his mind to let this NCM know.          Expected Discharge Plan and Services                                                 Social Determinants of Health (SDOH) Interventions    Readmission Risk Interventions No flowsheet data found.

## 2020-06-09 DIAGNOSIS — E875 Hyperkalemia: Secondary | ICD-10-CM | POA: Diagnosis not present

## 2020-06-09 LAB — GLUCOSE, CAPILLARY
Glucose-Capillary: 106 mg/dL — ABNORMAL HIGH (ref 70–99)
Glucose-Capillary: 116 mg/dL — ABNORMAL HIGH (ref 70–99)
Glucose-Capillary: 119 mg/dL — ABNORMAL HIGH (ref 70–99)
Glucose-Capillary: 145 mg/dL — ABNORMAL HIGH (ref 70–99)
Glucose-Capillary: 86 mg/dL (ref 70–99)
Glucose-Capillary: 98 mg/dL (ref 70–99)
Glucose-Capillary: 98 mg/dL (ref 70–99)

## 2020-06-09 LAB — BASIC METABOLIC PANEL
Anion gap: 10 (ref 5–15)
BUN: 25 mg/dL — ABNORMAL HIGH (ref 8–23)
CO2: 22 mmol/L (ref 22–32)
Calcium: 8 mg/dL — ABNORMAL LOW (ref 8.9–10.3)
Chloride: 105 mmol/L (ref 98–111)
Creatinine, Ser: 1.92 mg/dL — ABNORMAL HIGH (ref 0.61–1.24)
GFR, Estimated: 35 mL/min — ABNORMAL LOW (ref >60)
Glucose, Bld: 105 mg/dL — ABNORMAL HIGH (ref 70–99)
Potassium: 5 mmol/L (ref 3.5–5.1)
Sodium: 137 mmol/L (ref 135–145)

## 2020-06-09 LAB — CBC
HCT: 35.2 % — ABNORMAL LOW (ref 39.0–52.0)
Hemoglobin: 12.1 g/dL — ABNORMAL LOW (ref 13.0–17.0)
MCH: 32 pg (ref 26.0–34.0)
MCHC: 34.4 g/dL (ref 30.0–36.0)
MCV: 93.1 fL (ref 80.0–100.0)
Platelets: 217 10*3/uL (ref 150–400)
RBC: 3.78 MIL/uL — ABNORMAL LOW (ref 4.22–5.81)
RDW: 12 % (ref 11.5–15.5)
WBC: 6 10*3/uL (ref 4.0–10.5)
nRBC: 0 % (ref 0.0–0.2)

## 2020-06-09 MED ORDER — RISAQUAD PO CAPS
1.0000 | ORAL_CAPSULE | Freq: Every day | ORAL | Status: DC
  Administered 2020-06-09 – 2020-06-11 (×3): 1 via ORAL
  Filled 2020-06-09 (×3): qty 1

## 2020-06-09 MED ORDER — LOPERAMIDE HCL 2 MG PO CAPS
2.0000 mg | ORAL_CAPSULE | Freq: Two times a day (BID) | ORAL | Status: DC
Start: 2020-06-09 — End: 2020-06-11
  Administered 2020-06-09 – 2020-06-11 (×4): 2 mg via ORAL
  Filled 2020-06-09 (×4): qty 1

## 2020-06-09 NOTE — Plan of Care (Signed)

## 2020-06-09 NOTE — Progress Notes (Signed)
[facility] NEPHROLOGY PROGRESS NOTE  Assessment/ Plan: Pt is a 80 y.o. 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 Janit Pagan presented with around 5 days of nausea vomiting, decreased oral intake, diarrhea, seen as a consultation for the evaluation of hyperkalemia and AKI on CKD  #Severe hyperkalemia: 2/2 AKI and Acei use. Has improved with medical treatment -K stable at 5 today status post medical treatment with Lasix and Lokelma -Continue oral Lasix 40 mg daily -Lokelma 10g x3 days -> days left -Goal K <5.5 -Encourage oral hydration w/ lasix   #Acute kidney injury on CKD 3b: 2/2 acei and dehydration. Back to BL of 2. Cont to hold lisinopril   #Metabolic acidosis: Improved with IV bicarbonate. No bicarb needed currently  #Hypertension: Fluctuating Bps. Treatment appropriate currently  #Nausea vomiting and diarrhea: CT scan of abdomen pelvis without acute finding. Continues to have diarrhea. Working up outpatient  Given the patient's improvement in AKI and hyperkalemia we will sign off at this time. If we are needed in the future please page neurology on-call  Subjective: Patient states he feels well other than diarrhea which has been going on for about a month. No other changes. Potassium stable. Creatinine stable.  Objective Vital signs in last 24 hours: Vitals:   06/09/20 0009 06/09/20 0300 06/09/20 0440 06/09/20 0520  BP: 126/69   (!) 107/49  Pulse: 62 69 85 66  Resp: 14 20 12 18   Temp: 98.5 F (36.9 C)   98.4 F (36.9 C)  TempSrc: Oral   Oral  SpO2: 98% 94% 94% 93%  Weight:      Height:       Weight change:   Intake/Output Summary (Last 24 hours) at 06/09/2020 0752 Last data filed at 06/09/2020 0000 Gross per 24 hour  Intake --  Output 350 ml  Net -350 ml       Labs: Basic Metabolic Panel: Recent Labs  Lab 06/05/20 0353 06/05/20 0606 06/05/20 1505  06/06/20 0121 06/07/20 0033 06/08/20 0045 06/09/20 0041  NA 141 142 143   < > 140 141 137  K 5.6* 5.8* 5.4*   < > 5.8* 5.1 5.0  CL 112* 111 106   < > 107 107 105  CO2 20* 21* 27   < > 26 25 22   GLUCOSE 139* 155* 110*   < > 104* 107* 105*  BUN 66* 62* 51*   < > 33* 26* 25*  CREATININE 2.13* 2.12* 1.93*   < > 1.93* 1.98* 1.92*  CALCIUM 8.5* 8.2* 8.3*   < > 7.9* 7.9* 8.0*  PHOS 2.6 2.4* 2.4*  --   --   --   --    < > = values in this interval not displayed.   Liver Function Tests: Recent Labs  Lab 06/04/20 1219 06/04/20 2200 06/05/20 1505  AST 18 20  --   ALT 26 22  --   ALKPHOS 61 55  --   BILITOT 0.5 0.6  --   PROT 7.3 6.1*  --   ALBUMIN 3.9 3.4* 3.2*   Recent Labs  Lab 06/04/20 1219  LIPASE 114*   No results for input(s): AMMONIA in the last 168 hours. CBC: Recent Labs  Lab 06/04/20 1219 06/06/20 0121 06/07/20 0033 06/08/20 0045 06/09/20 0041  WBC 7.6 6.4 7.2 5.9 6.0  NEUTROABS  --  4.2  --   --   --  HGB 13.2 10.7* 12.1* 11.6* 12.1*  HCT 40.0 32.0* 35.6* 36.2* 35.2*  MCV 97.6 95.2 95.7 96.0 93.1  PLT 295 207 220 214 217   Cardiac Enzymes: No results for input(s): CKTOTAL, CKMB, CKMBINDEX, TROPONINI in the last 168 hours. CBG: Recent Labs  Lab 06/08/20 1622 06/08/20 2022 06/09/20 0022 06/09/20 0403 06/09/20 0723  GLUCAP 151* 111* 106* 98 119*    Iron Studies: No results for input(s): IRON, TIBC, TRANSFERRIN, FERRITIN in the last 72 hours. Studies/Results: No results found.  Medications: Infusions: . sodium chloride Stopped (06/04/20 1714)    Scheduled Medications: . carbamide peroxide  5 drop Left EAR BID  . feeding supplement (NEPRO CARB STEADY)  237 mL Oral BID BM  . furosemide  40 mg Oral Daily  . heparin  5,000 Units Subcutaneous Q8H  . insulin aspart  0-9 Units Subcutaneous Q4H  . metoprolol succinate  12.5 mg Oral Daily  . sodium zirconium cyclosilicate  10 g Oral Daily  . umeclidinium-vilanterol  1 puff Inhalation Daily    have  reviewed scheduled and prn medications.  Physical Exam: General:NAD, comfortable, sitting in bed Heart: Normal rate, no murmurs Lungs: Bilateral chest rise, no increased work of breathing Abdomen:soft, Non-tender, non-distended Extremities:No edema Neurology: Alert awake, nonfocal.  Steven Chang 06/09/2020,7:52 AM  LOS: 4 days

## 2020-06-09 NOTE — Progress Notes (Signed)
Progress Note    Steven Chang  XBD:[id number] DOB: [birth year]  DOA: 06/04/2020 PCP: Leola Brazil, DO    Brief Narrative:     Medical records reviewed and are as summarized below:  Steven Chang is an 80 y.o. male with history of hypertension, diabetes, chronic kidney disease stage IIIb with a creatinine of two.  He presented with nausea and vomiting and was found to have hyperkalemia and worsening renal failure.  Assessment/Plan:   Principal Problem:   Hyperkalemia Active Problems:   AKI (acute kidney injury) (HCC)   DM2 (diabetes mellitus, type 2) (HCC)   HTN (hypertension)   Malnutrition of moderate degree   Severe hyperkalemia: -DC lisinopril -Nephrology consult -IV Lasix, Lokelma -daily labs  Acute kidney injury on chronic kidney disease 3B: -Holding lisinopril -Improved with IV fluids-have been DC'd and patient has been restarted on Lasix  Reported diarrhea -Patient is a difficult historian but appears to be postprandial but not sure -Patient has seen Tri Elie Confer, [facility] of [facility] in the past so will defer further work-up to outpatient and [facility] -Infectious work-up negative so can do trial of Imodium -add probiotic  Hypertension -Restart low-dose beta-blocker  Overweight Body mass index is 25.78 kg/m.  Left ear wax impaction -debrox  Patient's wife says they have trouble with transporation  Family Communication/Anticipated D/C date and plan/Code Status   DVT prophylaxis: heparin Code Status: Full Code.  Disposition Plan: Status is: Inpatient Updated patient's wife's # in chart as was incorrect Remains inpatient appropriate because:Inpatient level of care appropriate due to severity of illness   Dispo: The patient is from: Home              Anticipated d/c is to: Home              Anticipated d/c date is: 1-2 days              Patient currently is not medically stable to d/c.         Medical Consultants:     renal  Subjective:   Think hearing is better in left ear -has episodes of loose stools this AM- not sure if associated with food- resolved with immodium  Objective:    Vitals:   06/09/20 0300 06/09/20 0440 06/09/20 0520 06/09/20 0758  BP:   (!) 107/49 112/61  Pulse: 69 85 66 76  Resp: 20 12 18 19   Temp:   98.4 F (36.9 C) 98.6 F (37 C)  TempSrc:   Oral Oral  SpO2: 94% 94% 93% 93%  Weight:      Height:        Intake/Output Summary (Last 24 hours) at 06/09/2020 1707 Last data filed at 06/09/2020 0000 Gross per 24 hour  Intake -  Output 350 ml  Net -350 ml   Filed Weights   06/04/20 1209 06/05/20 0210  Weight: 79.4 kg 76.9 kg    Exam:   General: Appearance:     Overweight male in no acute distress     Lungs:     respirations unlabored  Heart:    Normal heart rate. Normal rhythm. No murmurs, rubs, or gallops.   MS:   All extremities are intact.   Neurologic:   Awake, alert, poor historian    Data Reviewed:   I have personally reviewed following labs and imaging studies:  Labs: Labs show the following:   Basic Metabolic Panel: Recent Labs  Lab 06/04/20 1459 06/04/20 1609  06/05/20 0353 06/05/20 0606 06/05/20 1505 06/06/20 0121 06/07/20 0033 06/08/20 0045 06/09/20 0041  NA 139   < > 141 142 143 141 140 141 137  K 7.4*   < > 5.6* 5.8* 5.4* 6.1* 5.8* 5.1 5.0  CL 114*   < > 112* 111 106 109 107 107 105  CO2 17*   < > 20* 21* 27 25 26 25 22   GLUCOSE 117*   < > 139* 155* 110* 166* 104* 107* 105*  BUN 92*   < > 66* 62* 51* 44* 33* 26* 25*  CREATININE 2.63*   < > 2.13* 2.12* 1.93* 1.90* 1.93* 1.98* 1.92*  CALCIUM 8.4*   < > 8.5* 8.2* 8.3* 7.5* 7.9* 7.9* 8.0*  MG 1.2*  --   --  1.5*  --  2.0  --   --   --   PHOS  --   --  2.6 2.4* 2.4*  --   --   --   --    < > = values in this interval not displayed.   GFR Estimated Creatinine Clearance: 30.7 mL/min (A) (by C-G formula based on SCr of 1.92 mg/dL (H)). Liver Function Tests: Recent Labs  Lab  06/04/20 1219 06/04/20 2200 06/05/20 1505  AST 18 20  --   ALT 26 22  --   ALKPHOS 61 55  --   BILITOT 0.5 0.6  --   PROT 7.3 6.1*  --   ALBUMIN 3.9 3.4* 3.2*   Recent Labs  Lab 06/04/20 1219  LIPASE 114*   No results for input(s): AMMONIA in the last 168 hours. Coagulation profile No results for input(s): INR, PROTIME in the last 168 hours.  CBC: Recent Labs  Lab 06/04/20 1219 06/06/20 0121 06/07/20 0033 06/08/20 0045 06/09/20 0041  WBC 7.6 6.4 7.2 5.9 6.0  NEUTROABS  --  4.2  --   --   --   HGB 13.2 10.7* 12.1* 11.6* 12.1*  HCT 40.0 32.0* 35.6* 36.2* 35.2*  MCV 97.6 95.2 95.7 96.0 93.1  PLT 295 207 220 214 217   Cardiac Enzymes: No results for input(s): CKTOTAL, CKMB, CKMBINDEX, TROPONINI in the last 168 hours. BNP (last 3 results) No results for input(s): PROBNP in the last 8760 hours. CBG: Recent Labs  Lab 06/09/20 0022 06/09/20 0403 06/09/20 0723 06/09/20 1146 06/09/20 1548  GLUCAP 106* 98 119* 116* 86   D-Dimer: No results for input(s): DDIMER in the last 72 hours. Hgb A1c: No results for input(s): HGBA1C in the last 72 hours. Lipid Profile: No results for input(s): CHOL, HDL, LDLCALC, TRIG, CHOLHDL, LDLDIRECT in the last 72 hours. Thyroid function studies: No results for input(s): TSH, T4TOTAL, T3FREE, THYROIDAB in the last 72 hours.  Invalid input(s): FREET3 Anemia work up: No results for input(s): VITAMINB12, FOLATE, FERRITIN, TIBC, IRON, RETICCTPCT in the last 72 hours. Sepsis Labs: Recent Labs  Lab 06/06/20 0121 06/07/20 0033 06/08/20 0045 06/09/20 0041  WBC 6.4 7.2 5.9 6.0    Microbiology Recent Results (from the past 240 hour(s))  Resp Panel by RT-PCR (Flu A&B, Covid) Nasopharyngeal Swab     Status: None   Collection Time: 06/04/20  1:50 PM   Specimen: Nasopharyngeal Swab; Nasopharyngeal(NP) swabs in vial transport medium  Result Value Ref Range Status   SARS Coronavirus 2 by RT PCR NEGATIVE NEGATIVE Final    Comment:  (NOTE) SARS-CoV-2 target nucleic acids are NOT DETECTED.  The SARS-CoV-2 RNA is generally detectable in upper respiratory specimens during the  acute phase of infection. The lowest concentration of SARS-CoV-2 viral copies this assay can detect is 138 copies/mL. A negative result does not preclude SARS-Cov-2 infection and should not be used as the sole basis for treatment or other patient management decisions. A negative result may occur with  improper specimen collection/handling, submission of specimen other than nasopharyngeal swab, presence of viral mutation(s) within the areas targeted by this assay, and inadequate number of viral copies(<138 copies/mL). A negative result must be combined with clinical observations, patient history, and epidemiological information. The expected result is Negative.  Fact Sheet for Patients:  EntrepreneurPulse.com.au  Fact Sheet for Healthcare Providers:  IncredibleEmployment.be  This test is no t yet approved or cleared by the [place] FDA and  has been authorized for detection and/or diagnosis of SARS-CoV-2 by FDA under an Emergency Use Authorization (EUA). This EUA will remain  in effect (meaning this test can be used) for the duration of the COVID-19 declaration under Section 564(b)(1) of the Act, 21 U.S.C.section 360bbb-3(b)(1), unless the authorization is terminated  or revoked sooner.       Influenza A by PCR NEGATIVE NEGATIVE Final   Influenza B by PCR NEGATIVE NEGATIVE Final    Comment: (NOTE) The Xpert Xpress SARS-CoV-2/FLU/RSV plus assay is intended as an aid in the diagnosis of influenza from Nasopharyngeal swab specimens and should not be used as a sole basis for treatment. Nasal washings and aspirates are unacceptable for Xpert Xpress SARS-CoV-2/FLU/RSV testing.  Fact Sheet for Patients: EntrepreneurPulse.com.au  Fact Sheet for Healthcare  Providers: IncredibleEmployment.be  This test is not yet approved or cleared by the [place] FDA and has been authorized for detection and/or diagnosis of SARS-CoV-2 by FDA under an Emergency Use Authorization (EUA). This EUA will remain in effect (meaning this test can be used) for the duration of the COVID-19 declaration under Section 564(b)(1) of the Act, 21 U.S.C. section 360bbb-3(b)(1), unless the authorization is terminated or revoked.  Performed at [facility], [address] [place], [place] [address]   MRSA PCR Screening     Status: None   Collection Time: 06/05/20  2:12 AM   Specimen: Nasopharyngeal  Result Value Ref Range Status   MRSA by PCR NEGATIVE NEGATIVE Final    Comment:        The GeneXpert MRSA Assay (FDA approved for NASAL specimens only), is one component of a comprehensive MRSA colonization surveillance program. It is not intended to diagnose MRSA infection nor to guide or monitor treatment for MRSA infections. Performed at [facility], [place] [address], [place], [place] [address]   Gastrointestinal Panel by PCR , Stool     Status: None   Collection Time: 06/06/20  7:22 AM   Specimen: Stool  Result Value Ref Range Status   Campylobacter species NOT DETECTED NOT DETECTED Final   Plesimonas shigelloides NOT DETECTED NOT DETECTED Final   Salmonella species NOT DETECTED NOT DETECTED Final   Yersinia enterocolitica NOT DETECTED NOT DETECTED Final   Vibrio species NOT DETECTED NOT DETECTED Final   Vibrio cholerae NOT DETECTED NOT DETECTED Final   Enteroaggregative E coli (EAEC) NOT DETECTED NOT DETECTED Final   Enteropathogenic E coli (EPEC) NOT DETECTED NOT DETECTED Final   Enterotoxigenic E coli (ETEC) NOT DETECTED NOT DETECTED Final   Shiga like toxin producing E coli (STEC) NOT DETECTED NOT DETECTED Final   Shigella/Enteroinvasive E coli (EIEC) NOT DETECTED NOT DETECTED Final   Cryptosporidium NOT DETECTED NOT  DETECTED Final  Cyclospora cayetanensis NOT DETECTED NOT DETECTED Final   Entamoeba histolytica NOT DETECTED NOT DETECTED Final   Giardia lamblia NOT DETECTED NOT DETECTED Final   Adenovirus F40/41 NOT DETECTED NOT DETECTED Final   Astrovirus NOT DETECTED NOT DETECTED Final   Norovirus GI/GII NOT DETECTED NOT DETECTED Final   Rotavirus A NOT DETECTED NOT DETECTED Final   Sapovirus (I, II, IV, and V) NOT DETECTED NOT DETECTED Final    Comment: Performed at [facility], [address], [place], [place] [address]  C Difficile Quick Screen (NO PCR Reflex)     Status: None   Collection Time: 06/06/20  7:22 AM   Specimen: STOOL  Result Value Ref Range Status   C Diff antigen NEGATIVE NEGATIVE Final   C Diff toxin NEGATIVE NEGATIVE Final   C Diff interpretation No C. difficile detected.  Final    Comment: Performed at [facility], [address], [place], [place] [address]    Procedures and diagnostic studies:  No results found.  Medications:   . carbamide peroxide  5 drop Left EAR BID  . feeding supplement (NEPRO CARB STEADY)  237 mL Oral BID BM  . furosemide  40 mg Oral Daily  . heparin  5,000 Units Subcutaneous Q8H  . insulin aspart  0-9 Units Subcutaneous Q4H  . metoprolol succinate  12.5 mg Oral Daily  . sodium zirconium cyclosilicate  10 g Oral Daily  . umeclidinium-vilanterol  1 puff Inhalation Daily   Continuous Infusions: . sodium chloride Stopped (06/04/20 1714)     LOS: 4 days   Joseph Art  [facility]   How to contact the [facility] Attending or Consulting provider 7A - 7P or covering provider during after hours 7P -7A, for this patient?  1. Check the care team in [facility] and look for a) attending/consulting [facility] provider listed and b) the [facility] team listed 2. Log into [email or web address] and use 's universal password to access. If you do not have the password, please contact the hospital operator. 3. Locate the [facility] provider you are looking  for under [facility] and page to a number that you can be directly reached. 4. If you still have difficulty reaching the provider, please page the [facility] (Director on Call) for the Hospitalists listed on amion for assistance.  06/09/2020, 5:07 PM

## 2020-06-10 DIAGNOSIS — E875 Hyperkalemia: Secondary | ICD-10-CM | POA: Diagnosis not present

## 2020-06-10 LAB — CBC
HCT: 39.2 % (ref 39.0–52.0)
Hemoglobin: 12.7 g/dL — ABNORMAL LOW (ref 13.0–17.0)
MCH: 30.9 pg (ref 26.0–34.0)
MCHC: 32.4 g/dL (ref 30.0–36.0)
MCV: 95.4 fL (ref 80.0–100.0)
Platelets: 210 10*3/uL (ref 150–400)
RBC: 4.11 MIL/uL — ABNORMAL LOW (ref 4.22–5.81)
RDW: 11.9 % (ref 11.5–15.5)
WBC: 7.1 10*3/uL (ref 4.0–10.5)
nRBC: 0 % (ref 0.0–0.2)

## 2020-06-10 LAB — GLUCOSE, CAPILLARY
Glucose-Capillary: 101 mg/dL — ABNORMAL HIGH (ref 70–99)
Glucose-Capillary: 110 mg/dL — ABNORMAL HIGH (ref 70–99)
Glucose-Capillary: 124 mg/dL — ABNORMAL HIGH (ref 70–99)
Glucose-Capillary: 126 mg/dL — ABNORMAL HIGH (ref 70–99)
Glucose-Capillary: 196 mg/dL — ABNORMAL HIGH (ref 70–99)
Glucose-Capillary: 90 mg/dL (ref 70–99)

## 2020-06-10 LAB — BASIC METABOLIC PANEL
Anion gap: 10 (ref 5–15)
BUN: 27 mg/dL — ABNORMAL HIGH (ref 8–23)
CO2: 21 mmol/L — ABNORMAL LOW (ref 22–32)
Calcium: 8.3 mg/dL — ABNORMAL LOW (ref 8.9–10.3)
Chloride: 107 mmol/L (ref 98–111)
Creatinine, Ser: 2.03 mg/dL — ABNORMAL HIGH (ref 0.61–1.24)
GFR, Estimated: 33 mL/min — ABNORMAL LOW (ref >60)
Glucose, Bld: 107 mg/dL — ABNORMAL HIGH (ref 70–99)
Potassium: 5.1 mmol/L (ref 3.5–5.1)
Sodium: 138 mmol/L (ref 135–145)

## 2020-06-10 LAB — MAGNESIUM: Magnesium: 1.5 mg/dL — ABNORMAL LOW (ref 1.7–2.4)

## 2020-06-10 LAB — LIPASE, BLOOD: Lipase: 81 U/L — ABNORMAL HIGH (ref 11–51)

## 2020-06-10 NOTE — Progress Notes (Signed)
Progress Note    Steven Chang  FWY:[id number] DOB: 11/22/1940  DOA: 06/04/2020 PCP: Leola Brazil, DO    Brief Narrative:     Medical records reviewed and are as summarized below:  Steven Chang is an 80 y.o. male with history of hypertension, diabetes, chronic kidney disease stage IIIb with a creatinine of two.  He presented with nausea and vomiting and was found to have hyperkalemia and worsening renal failure.  Assessment/Plan:   Principal Problem:   Hyperkalemia Active Problems:   AKI (acute kidney injury) (HCC)   DM2 (diabetes mellitus, type 2) (HCC)   HTN (hypertension)   Malnutrition of moderate degree   Severe hyperkalemia: -DC lisinopril -Nephrology consult appreciated -IV Lasix, Lokelma -daily labs  Acute kidney injury on chronic kidney disease 3B: -Holding lisinopril -Improved with IV fluids-have been DC'd and patient has been restarted on Lasix  Reported diarrhea -Patient is a difficult historian but appears to be postprandial but not sure -Patient has seen Tri Elie Confer, MD of [facility] in the past so will defer further work-up to outpatient and [facility] -Infectious work-up negative so can do trial of Imodium -add probiotic -only 2BMs today and patient feels like he is eating better  Hypertension -Restart low-dose beta-blocker  Overweight Body mass index is 25.78 kg/m.  Left ear wax impaction -debrox  Patient's wife says they have trouble with transporation- care management consult  Family Communication/Anticipated D/C date and plan/Code Status   DVT prophylaxis: heparin Code Status: Full Code.  Disposition Plan: Status is: Inpatient Updated patient's wife's # in chart as was incorrect Remains inpatient appropriate because:Inpatient level of care appropriate due to severity of illness   Dispo: The patient is from: Home              Anticipated d/c is to: Home              Anticipated d/c date is: 1-2 days              Patient  currently is not medically stable to d/c.         Medical Consultants:    renal  Subjective:   Feels like he is eating better today  Objective:    Vitals:   06/10/20 0739 06/10/20 0748 06/10/20 1112 06/10/20 1537  BP: (!) 109/56  (!) 100/52 122/69  Pulse: 74  74 67  Resp: 16  17 14   Temp: 98.7 F (37.1 C)  98.5 F (36.9 C) 98.2 F (36.8 C)  TempSrc: Oral  Oral Oral  SpO2: 90% 95% 92% 96%  Weight:      Height:        Intake/Output Summary (Last 24 hours) at 06/10/2020 1734 Last data filed at 06/09/2020 2320 Gross per 24 hour  Intake 240 ml  Output 300 ml  Net -60 ml   Filed Weights   06/04/20 1209 06/05/20 0210  Weight: 79.4 kg 76.9 kg    Exam:  General: Appearance:     Overweight male in no acute distress     Lungs:     respirations unlabored  Heart:    Normal heart rate. Normal rhythm. No murmurs, rubs, or gallops.   MS:   All extremities are intact.   Neurologic:   Awake, alert, oriented x 3. No apparent focal neurological           defect.      Data Reviewed:   I have personally reviewed following labs and  imaging studies:  Labs: Labs show the following:   Basic Metabolic Panel: Recent Labs  Lab 06/04/20 1459 06/04/20 1609 06/05/20 0353 06/05/20 0606 06/05/20 1505 06/06/20 0121 06/07/20 0033 06/08/20 0045 06/09/20 0041 06/10/20 0050  NA 139   < > 141 142 143 141 140 141 137 138  K 7.4*   < > 5.6* 5.8* 5.4* 6.1* 5.8* 5.1 5.0 5.1  CL 114*   < > 112* 111 106 109 107 107 105 107  CO2 17*   < > 20* 21* 27 25 26 25 22  21*  GLUCOSE 117*   < > 139* 155* 110* 166* 104* 107* 105* 107*  BUN 92*   < > 66* 62* 51* 44* 33* 26* 25* 27*  CREATININE 2.63*   < > 2.13* 2.12* 1.93* 1.90* 1.93* 1.98* 1.92* 2.03*  CALCIUM 8.4*   < > 8.5* 8.2* 8.3* 7.5* 7.9* 7.9* 8.0* 8.3*  MG 1.2*  --   --  1.5*  --  2.0  --   --   --  1.5*  PHOS  --   --  2.6 2.4* 2.4*  --   --   --   --   --    < > = values in this interval not displayed.   GFR Estimated  Creatinine Clearance: 29 mL/min (A) (by C-G formula based on SCr of 2.03 mg/dL (H)). Liver Function Tests: Recent Labs  Lab 06/04/20 1219 06/04/20 2200 06/05/20 1505  AST 18 20  --   ALT 26 22  --   ALKPHOS 61 55  --   BILITOT 0.5 0.6  --   PROT 7.3 6.1*  --   ALBUMIN 3.9 3.4* 3.2*   Recent Labs  Lab 06/04/20 1219 06/10/20 0050  LIPASE 114* 81*   No results for input(s): AMMONIA in the last 168 hours. Coagulation profile No results for input(s): INR, PROTIME in the last 168 hours.  CBC: Recent Labs  Lab 06/06/20 0121 06/07/20 0033 06/08/20 0045 06/09/20 0041 06/10/20 0050  WBC 6.4 7.2 5.9 6.0 7.1  NEUTROABS 4.2  --   --   --   --   HGB 10.7* 12.1* 11.6* 12.1* 12.7*  HCT 32.0* 35.6* 36.2* 35.2* 39.2  MCV 95.2 95.7 96.0 93.1 95.4  PLT 207 220 214 217 210   Cardiac Enzymes: No results for input(s): CKTOTAL, CKMB, CKMBINDEX, TROPONINI in the last 168 hours. BNP (last 3 results) No results for input(s): PROBNP in the last 8760 hours. CBG: Recent Labs  Lab 06/09/20 2324 06/10/20 0244 06/10/20 0737 06/10/20 1111 06/10/20 1538  GLUCAP 98 101* 90 124* 110*   D-Dimer: No results for input(s): DDIMER in the last 72 hours. Hgb A1c: No results for input(s): HGBA1C in the last 72 hours. Lipid Profile: No results for input(s): CHOL, HDL, LDLCALC, TRIG, CHOLHDL, LDLDIRECT in the last 72 hours. Thyroid function studies: No results for input(s): TSH, T4TOTAL, T3FREE, THYROIDAB in the last 72 hours.  Invalid input(s): FREET3 Anemia work up: No results for input(s): VITAMINB12, FOLATE, FERRITIN, TIBC, IRON, RETICCTPCT in the last 72 hours. Sepsis Labs: Recent Labs  Lab 06/07/20 0033 06/08/20 0045 06/09/20 0041 06/10/20 0050  WBC 7.2 5.9 6.0 7.1    Microbiology Recent Results (from the past 240 hour(s))  Resp Panel by RT-PCR (Flu A&B, Covid) Nasopharyngeal Swab     Status: None   Collection Time: 06/04/20  1:50 PM   Specimen: Nasopharyngeal Swab;  Nasopharyngeal(NP) swabs in vial transport medium  Result Value Ref  Range Status   SARS Coronavirus 2 by RT PCR NEGATIVE NEGATIVE Final    Comment: (NOTE) SARS-CoV-2 target nucleic acids are NOT DETECTED.  The SARS-CoV-2 RNA is generally detectable in upper respiratory specimens during the acute phase of infection. The lowest concentration of SARS-CoV-2 viral copies this assay can detect is 138 copies/mL. A negative result does not preclude SARS-Cov-2 infection and should not be used as the sole basis for treatment or other patient management decisions. A negative result may occur with  improper specimen collection/handling, submission of specimen other than nasopharyngeal swab, presence of viral mutation(s) within the areas targeted by this assay, and inadequate number of viral copies(<138 copies/mL). A negative result must be combined with clinical observations, patient history, and epidemiological information. The expected result is Negative.  Fact Sheet for Patients:  BloggerCourse.com  Fact Sheet for Healthcare Providers:  SeriousBroker.it  This test is no t yet approved or cleared by the [place] FDA and  has been authorized for detection and/or diagnosis of SARS-CoV-2 by FDA under an Emergency Use Authorization (EUA). This EUA will remain  in effect (meaning this test can be used) for the duration of the COVID-19 declaration under Section 564(b)(1) of the Act, 21 U.S.C.section 360bbb-3(b)(1), unless the authorization is terminated  or revoked sooner.       Influenza A by PCR NEGATIVE NEGATIVE Final   Influenza B by PCR NEGATIVE NEGATIVE Final    Comment: (NOTE) The Xpert Xpress SARS-CoV-2/FLU/RSV plus assay is intended as an aid in the diagnosis of influenza from Nasopharyngeal swab specimens and should not be used as a sole basis for treatment. Nasal washings and aspirates are unacceptable for Xpert Xpress  SARS-CoV-2/FLU/RSV testing.  Fact Sheet for Patients: BloggerCourse.com  Fact Sheet for Healthcare Providers: SeriousBroker.it  This test is not yet approved or cleared by the [place] FDA and has been authorized for detection and/or diagnosis of SARS-CoV-2 by FDA under an Emergency Use Authorization (EUA). This EUA will remain in effect (meaning this test can be used) for the duration of the COVID-19 declaration under Section 564(b)(1) of the Act, 21 U.S.C. section 360bbb-3(b)(1), unless the authorization is terminated or revoked.  Performed at [facility], [address] [place], [place] [address]   MRSA PCR Screening     Status: None   Collection Time: 06/05/20  2:12 AM   Specimen: Nasopharyngeal  Result Value Ref Range Status   MRSA by PCR NEGATIVE NEGATIVE Final    Comment:        The GeneXpert MRSA Assay (FDA approved for NASAL specimens only), is one component of a comprehensive MRSA colonization surveillance program. It is not intended to diagnose MRSA infection nor to guide or monitor treatment for MRSA infections. Performed at [facility], [address], [place], [place] [address]   Gastrointestinal Panel by PCR , Stool     Status: None   Collection Time: 06/06/20  7:22 AM   Specimen: Stool  Result Value Ref Range Status   Campylobacter species NOT DETECTED NOT DETECTED Final   Plesimonas shigelloides NOT DETECTED NOT DETECTED Final   Salmonella species NOT DETECTED NOT DETECTED Final   Yersinia enterocolitica NOT DETECTED NOT DETECTED Final   Vibrio species NOT DETECTED NOT DETECTED Final   Vibrio cholerae NOT DETECTED NOT DETECTED Final   Enteroaggregative E coli (EAEC) NOT DETECTED NOT DETECTED Final   Enteropathogenic E coli (EPEC) NOT DETECTED NOT DETECTED Final   Enterotoxigenic E coli (ETEC) NOT  DETECTED NOT DETECTED Final   Shiga like toxin producing E coli (STEC) NOT  DETECTED NOT DETECTED Final   Shigella/Enteroinvasive E coli (EIEC) NOT DETECTED NOT DETECTED Final   Cryptosporidium NOT DETECTED NOT DETECTED Final   Cyclospora cayetanensis NOT DETECTED NOT DETECTED Final   Entamoeba histolytica NOT DETECTED NOT DETECTED Final   Giardia lamblia NOT DETECTED NOT DETECTED Final   Adenovirus F40/41 NOT DETECTED NOT DETECTED Final   Astrovirus NOT DETECTED NOT DETECTED Final   Norovirus GI/GII NOT DETECTED NOT DETECTED Final   Rotavirus A NOT DETECTED NOT DETECTED Final   Sapovirus (I, II, IV, and V) NOT DETECTED NOT DETECTED Final    Comment: Performed at [facility], [address] [place], [place] [address]  C Difficile Quick Screen (NO PCR Reflex)     Status: None   Collection Time: 06/06/20  7:22 AM   Specimen: STOOL  Result Value Ref Range Status   C Diff antigen NEGATIVE NEGATIVE Final   C Diff toxin NEGATIVE NEGATIVE Final   C Diff interpretation No C. difficile detected.  Final    Comment: Performed at [facility], [place] [address], [place], [place] [address]    Procedures and diagnostic studies:  No results found.  Medications:   . acidophilus  1 capsule Oral Daily  . carbamide peroxide  5 drop Left EAR BID  . feeding supplement (NEPRO CARB STEADY)  237 mL Oral BID BM  . furosemide  40 mg Oral Daily  . heparin  5,000 Units Subcutaneous Q8H  . insulin aspart  0-9 Units Subcutaneous Q4H  . loperamide  2 mg Oral BID  . metoprolol succinate  12.5 mg Oral Daily  . umeclidinium-vilanterol  1 puff Inhalation Daily   Continuous Infusions: . sodium chloride Stopped (06/04/20 1714)     LOS: 5 days   Geradine Girt  [facility]   How to contact the [facility] Attending or Consulting provider [place] or covering provider during after hours [place], for this patient?  1. Check the care team in [facility] and look for a) attending/consulting [facility] provider listed and b) the [facility] team listed 2. Log into [email or web address] and use [facility]'s universal password to access. If you do not have the password, please contact the hospital operator. 3. Locate the [facility] provider you are looking for under [facility] and page to a number that you can be directly reached. 4. If you still have difficulty reaching the provider, please page the [facility] (Director on Call) for the Hospitalists listed on amion for assistance.  06/10/2020, 5:34 PM

## 2020-06-10 NOTE — Plan of Care (Signed)

## 2020-06-10 NOTE — Progress Notes (Signed)
Physical Therapy Treatment Patient Details Name: Steven Chang MRN: [id number] DOB: 04-10-1941 Today's Date: 06/10/2020    History of Present Illness 80 y.o. male with medical history significant of CKD 3b with baseline creat of ~1.9-2, DM2, HTN, COPD. Presents to ED12/27 with AMS, periods of emesis and diarrhea over the past 5 weeks. Admitted for kyperkalemia, and AKI    PT Comments    Pt continues to progress with mobility. Ready for DC home from PT standpoint when medically ready.    Follow Up Recommendations  No PT follow up     Equipment Recommendations  None recommended by PT    Recommendations for Other Services       Precautions / Restrictions Precautions Precautions: None Restrictions Weight Bearing Restrictions: No    Mobility  Bed Mobility Overal bed mobility: Independent                Transfers Overall transfer level: Independent Equipment used: None Transfers: Sit to/from Stand Sit to Stand: Independent            Ambulation/Gait Ambulation/Gait assistance: Supervision Gait Distance (Feet): 350 Feet Assistive device: None Gait Pattern/deviations: Step-through pattern Gait velocity: adequate Gait velocity interpretation: 1.31 - 2.62 ft/sec, indicative of limited community ambulator General Gait Details: supervision for safety   Stairs             Wheelchair Mobility    Modified Rankin (Stroke Patients Only)       Balance Overall balance assessment: Mild deficits observed, not formally tested                                          Cognition Arousal/Alertness: Awake/alert Behavior During Therapy: WFL for tasks assessed/performed Overall Cognitive Status: Within Functional Limits for tasks assessed                                        Exercises      General Comments        Pertinent Vitals/Pain      Home Living                      Prior Function            PT  Goals (current goals can now be found in the care plan section) Acute Rehab PT Goals Patient Stated Goal: feel better Progress towards PT goals: Progressing toward goals;Goals met and updated - see care plan    Frequency    Min 3X/week      PT Plan Discharge plan needs to be updated    Co-evaluation              AM-PAC PT "6 Clicks" Mobility   Outcome Measure  Help needed turning from your back to your side while in a flat bed without using bedrails?: None Help needed moving from lying on your back to sitting on the side of a flat bed without using bedrails?: None Help needed moving to and from a bed to a chair (including a wheelchair)?: None Help needed standing up from a chair using your arms (e.g., wheelchair or bedside chair)?: None Help needed to walk in hospital room?: None Help needed climbing 3-5 steps with a railing? : A Little 6 Click Score: 23  End of Session   Activity Tolerance: Patient tolerated treatment well Patient left: in bed;with call bell/phone within reach   PT Visit Diagnosis: Unsteadiness on feet (R26.81);Other abnormalities of gait and mobility (R26.89);Muscle weakness (generalized) (M62.81)     Time: 2409-7353 PT Time Calculation (min) (ACUTE ONLY): 10 min  Charges:  $Gait Training: 8-22 mins                     [place] Pager [phone] Office [place] 06/10/2020, 9:50 AM

## 2020-06-11 ENCOUNTER — Other Ambulatory Visit (HOSPITAL_COMMUNITY): Payer: Self-pay | Admitting: Internal Medicine

## 2020-06-11 LAB — BASIC METABOLIC PANEL
Anion gap: 9 (ref 5–15)
BUN: 30 mg/dL — ABNORMAL HIGH (ref 8–23)
CO2: 23 mmol/L (ref 22–32)
Calcium: 8.2 mg/dL — ABNORMAL LOW (ref 8.9–10.3)
Chloride: 105 mmol/L (ref 98–111)
Creatinine, Ser: 2.01 mg/dL — ABNORMAL HIGH (ref 0.61–1.24)
GFR, Estimated: 33 mL/min — ABNORMAL LOW (ref >60)
Glucose, Bld: 110 mg/dL — ABNORMAL HIGH (ref 70–99)
Potassium: 5 mmol/L (ref 3.5–5.1)
Sodium: 137 mmol/L (ref 135–145)

## 2020-06-11 LAB — CBC
HCT: 37.8 % — ABNORMAL LOW (ref 39.0–52.0)
Hemoglobin: 12.2 g/dL — ABNORMAL LOW (ref 13.0–17.0)
MCH: 31 pg (ref 26.0–34.0)
MCHC: 32.3 g/dL (ref 30.0–36.0)
MCV: 96.2 fL (ref 80.0–100.0)
Platelets: 208 10*3/uL (ref 150–400)
RBC: 3.93 MIL/uL — ABNORMAL LOW (ref 4.22–5.81)
RDW: 11.9 % (ref 11.5–15.5)
WBC: 6.7 10*3/uL (ref 4.0–10.5)
nRBC: 0 % (ref 0.0–0.2)

## 2020-06-11 LAB — GLUCOSE, CAPILLARY
Glucose-Capillary: 98 mg/dL (ref 70–99)
Glucose-Capillary: 107 mg/dL — ABNORMAL HIGH (ref 70–99)
Glucose-Capillary: 134 mg/dL — ABNORMAL HIGH (ref 70–99)

## 2020-06-11 LAB — MAGNESIUM: Magnesium: 1.6 mg/dL — ABNORMAL LOW (ref 1.7–2.4)

## 2020-06-11 MED ORDER — MAGNESIUM SULFATE 2 GM/50ML IV SOLN
2.0000 g | Freq: Once | INTRAVENOUS | Status: AC
  Administered 2020-06-11: 2 g via INTRAVENOUS
  Filled 2020-06-11: qty 50

## 2020-06-11 MED ORDER — SACCHAROMYCES BOULARDII 250 MG PO CAPS: 250.0000 mg | ORAL_CAPSULE | Freq: Two times a day (BID) | ORAL | 0 refills | Status: DC

## 2020-06-11 MED ORDER — LOPERAMIDE HCL 2 MG PO CAPS: 2.0000 mg | ORAL_CAPSULE | Freq: Two times a day (BID) | ORAL | 0 refills | Status: DC

## 2020-06-11 MED ORDER — RISAQUAD PO CAPS: 1.0000 | ORAL_CAPSULE | Freq: Every day | ORAL | 0 refills | Status: DC

## 2020-06-11 MED ORDER — FUROSEMIDE 40 MG PO TABS: 40.0000 mg | ORAL_TABLET | Freq: Every day | ORAL | 0 refills | Status: DC

## 2020-06-11 MED FILL — FUROSEMIDE 40 MG TABLET: 40 | 30 days supply | Qty: 30 | Fill #0

## 2020-06-11 MED FILL — FLORASTOR 250 MG CAPSULE: 250 | 30 days supply | Qty: 60 | Fill #0

## 2020-06-11 MED FILL — LOPERAMIDE 2 MG CAPSULE: 2 | 30 days supply | Qty: 60 | Fill #0

## 2020-06-11 NOTE — Discharge Summary (Signed)
Physician Discharge Summary  Steven Chang FVC:[id number] DOB: 1940/08/28 DOA: 06/04/2020  PCP: Leola Brazil, DO  Admit date: 06/04/2020 Discharge date: 06/11/2020  Admitted From: home Discharge disposition: home   Recommendations for Outpatient Follow-Up:   BMP 1 week Needs referral to GI if continues to have issue with diarrhea Lisinopril d/c'd due to AKI and hyperklaemia  Discharge Diagnosis:   Principal Problem:   Hyperkalemia Active Problems:   AKI (acute kidney injury) (HCC)   DM2 (diabetes mellitus, type 2) (HCC)   HTN (hypertension)   Malnutrition of moderate degree    Discharge Condition: Improved.  Diet recommendation: Low sodium, heart healthy.  Carbohydrate-modified.   Wound care: None.  Code status: Full.   History of Present Illness:   Steven Chang is a 80 y.o. male with medical history significant of CKD 3b with baseline creat of ~1.9-2, DM2, HTN, COPD.  Pt presents to the ED at [facility] with AMS.  Per son pt with frequent confusion for past 5 weeks or so.  Patient having daily episodes of NBNB emesis, and NB diarrhea for the pats 5 weeks or so.  Poor PO intake as a result.   ED Course: Initially pt disoriented to year in ED.  Work up in ED concerning for K > 7.5 initially confirmed on repeat BMP.  Creat 2.72, BUN 92.  CT head no acute findings.  CT AP: also no acute findings (just a question of chronic aortic dissection given displacement of calcium plaque, but this is unchanged since at least July 2021).  Pt denies abd pain, just occasional "fullness".  Does note that diarrhea going on "for like a month".  No CP, no SOB.  His biggest complaint at the moment is discomfort related to the foley catheter.  Pt given lokelma, IVF boluses, calcium gluconate, insulin + D50.  Nephro called and pt started on bicarb gtt.  Has actually had fairly good UOP since foley placement: At least 1375 out with another 400 documented under "Other  Uretheral catheter" but showing up as intake for some reason.   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: -d/c lisinopril -Improved with IV fluids-have been DC'd and patient has been restarted on Lasix  Reported diarrhea -Patient is a difficult historian but appears to be postprandial but not sure- eating pizza so could be dietary vs h/o alcohol use and pancreatic insuff -Patient has seen Tri Elie Confer, MD of [facility] in the past so will defer further work-up to outpatient and [facility] -Infectious work-up negative so can do trial of Imodium -add probiotic -CT Scan unrelvealing  Hypertension -low-dose beta-blocker -sopped ACE due to hyperkalemia  Overweight Body mass index is 25.78 kg/m.  Left ear wax impaction -debrox -upon re-eval cleared    Medical Consultants:   renal   Discharge Exam:   Vitals:   06/11/20 1053 06/11/20 1059  BP: 125/63 125/63  Pulse: 64 63  Resp: 20   Temp: 98.4 F (36.9 C)   SpO2: 97%    Vitals:   06/11/20 0736 06/11/20 0809 06/11/20 1053 06/11/20 1059  BP: 111/68  125/63 125/63  Pulse: 93  64 63  Resp: 14  20   Temp: 98.3 F (36.8 C)  98.4 F (36.9 C)   TempSrc: Oral  Oral   SpO2: 94% 95% 97%   Weight:  Height:        General exam: Appears calm and comfortable. [employer] pizza at bedside  The results of significant diagnostics from this hospitalization (including imaging, microbiology, ancillary and laboratory) are listed below for reference.     Procedures and Diagnostic Studies:   CT ABDOMEN PELVIS WO CONTRAST  Result Date: 06/04/2020 CLINICAL DATA:  Vomiting and diarrhea EXAM: CT ABDOMEN AND PELVIS WITHOUT CONTRAST TECHNIQUE: Multidetector CT imaging of the abdomen and pelvis was performed following the standard protocol without IV contrast. COMPARISON:   12/13/2019 FINDINGS: Lower chest: No acute abnormality. Hepatobiliary: Few punctate calcified granulomas. Gallbladder is unremarkable. No biliary dilatation. Pancreas: Unremarkable. Spleen: Unremarkable. Adrenals/Urinary Tract: Adrenals are unremarkable. Atrophic right kidney unchanged partially calcified upper pole cyst and nonobstructing upper pole renal calculus. Left renal cysts unchanged. Bladder is unremarkable. Stomach/Bowel: Stomach is within normal limits. Bowel is normal in caliber. Normal appendix. Vascular/Lymphatic: Aortic atherosclerosis. Some intimal calcification is displaced relative to the outer wall and may reflect chronic dissection. This is unchanged. No enlarged lymph nodes. Reproductive: Stable appearance of prostate. Other: No ascites.  No acute abnormality of the abdominal wall. Musculoskeletal: No acute osseous abnormality. Lumbar degenerative changes are greatest at L5-S1. IMPRESSION: No acute abnormality. 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 Electronically Signed   By: Guadlupe Spanish M.D.   On: 06/04/2020 15:02   CT Head Wo Contrast  Result Date: 06/04/2020 CLINICAL DATA:  Mental status change, unknown cause. Additional history provided: Confusion for 5 weeks, mental status change, vomiting, diarrhea, no appetite. EXAM: CT HEAD WITHOUT CONTRAST TECHNIQUE: Contiguous axial images were obtained from the base of the skull through the vertex without intravenous contrast. COMPARISON:  Brain MRI 05/30/2017. Head CT 05/30/2017. FINDINGS: Brain: Mildly motion degraded exam. Mild cerebral atrophy. 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 Background mild ill-defined hypoattenuation within the cerebral white matter is nonspecific, but compatible with chronic small vessel ischemic disease. There is no acute intracranial hemorrhage. No demarcated cortical infarct. No extra-axial fluid collection. No evidence of  intracranial mass. No midline shift. Vascular: No hyperdense vessel.  Atherosclerotic calcifications Skull: Mildly motion degraded exam. Sinuses/Orbits: Visualized orbits show no acute finding. No significant paranasal sinus disease at the imaged levels. IMPRESSION: Mildly motion degraded exam. No evidence of acute intracranial abnormality. 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 Stable background mild generalized atrophy and chronic small vessel ischemic disease. Electronically Signed   By: Jackey Loge DO   On: 06/04/2020 14:59   DG Chest Portable 1 View  Result Date: 06/04/2020 CLINICAL DATA:  Altered mental status EXAM: PORTABLE CHEST 1 VIEW COMPARISON:  December 13, 2019 FINDINGS: Lungs are clear. Heart size and pulmonary vascularity are normal. No adenopathy. There is aortic atherosclerosis. No bone lesions. IMPRESSION: Lungs clear.  Cardiac silhouette normal. Aortic Atherosclerosis (ICD10-I70.0). Electronically Signed   By: Bretta Bang III M.D.   On: 06/04/2020 14:08     Labs:   Basic Metabolic Panel: Recent Labs  Lab 06/04/20 1459 06/04/20 1609 06/05/20 0353 06/05/20 0606 06/05/20 1505 06/06/20 0121 06/07/20 0033 06/08/20 0045 06/09/20 0041 06/10/20 0050 06/11/20 0036  NA 139   < > 141 142 143 141 140 141 137 138 137  K 7.4*   < > 5.6* 5.8* 5.4* 6.1* 5.8* 5.1 5.0 5.1 5.0  CL 114*   < > 112* 111 106 109 107 107 105 107 105  CO2 17*   < > 20* 21* 27  25 26 25 22  21* 23  GLUCOSE 117*   < > 139* 155* 110* 166* 104* 107* 105* 107* 110*  BUN 92*   < > 66* 62* 51* 44* 33* 26* 25* 27* 30*  CREATININE 2.63*   < > 2.13* 2.12* 1.93* 1.90* 1.93* 1.98* 1.92* 2.03* 2.01*  CALCIUM 8.4*   < > 8.5* 8.2* 8.3* 7.5* 7.9* 7.9* 8.0* 8.3* 8.2*  MG 1.2*  --   --  1.5*  --  2.0  --   --   --  1.5* 1.6*  PHOS  --   --  2.6 2.4* 2.4*  --   --   --   --   --   --    < > = values in this interval not displayed.   GFR Estimated Creatinine  Clearance: 29.3 mL/min (A) (by C-G formula based on SCr of 2.01 mg/dL (H)). Liver Function Tests: Recent Labs  Lab 06/04/20 1219 06/04/20 2200 06/05/20 1505  AST 18 20  --   ALT 26 22  --   ALKPHOS 61 55  --   BILITOT 0.5 0.6  --   PROT 7.3 6.1*  --   ALBUMIN 3.9 3.4* 3.2*   Recent Labs  Lab 06/04/20 1219 06/10/20 0050  LIPASE 114* 81*   No results for input(s): AMMONIA in the last 168 hours. Coagulation profile No results for input(s): INR, PROTIME in the last 168 hours.  CBC: Recent Labs  Lab 06/06/20 0121 06/07/20 0033 06/08/20 0045 06/09/20 0041 06/10/20 0050 06/11/20 0036  WBC 6.4 7.2 5.9 6.0 7.1 6.7  NEUTROABS 4.2  --   --   --   --   --   HGB 10.7* 12.1* 11.6* 12.1* 12.7* 12.2*  HCT 32.0* 35.6* 36.2* 35.2* 39.2 37.8*  MCV 95.2 95.7 96.0 93.1 95.4 96.2  PLT 207 220 214 217 210 208   Cardiac Enzymes: No results for input(s): CKTOTAL, CKMB, CKMBINDEX, TROPONINI in the last 168 hours. BNP: Invalid input(s): POCBNP CBG: Recent Labs  Lab 06/10/20 1931 06/10/20 2349 06/11/20 0442 06/11/20 0732 06/11/20 1051  GLUCAP 196* 126* 98 107* 134*   D-Dimer No results for input(s): DDIMER in the last 72 hours. Hgb A1c No results for input(s): HGBA1C in the last 72 hours. Lipid Profile No results for input(s): CHOL, HDL, LDLCALC, TRIG, CHOLHDL, LDLDIRECT in the last 72 hours. Thyroid function studies No results for input(s): TSH, T4TOTAL, T3FREE, THYROIDAB in the last 72 hours.  Invalid input(s): FREET3 Anemia work up No results for input(s): VITAMINB12, FOLATE, FERRITIN, TIBC, IRON, RETICCTPCT in the last 72 hours. Microbiology Recent Results (from the past 240 hour(s))  Resp Panel by RT-PCR (Flu A&B, Covid) Nasopharyngeal Swab     Status: None   Collection Time: 06/04/20  1:50 PM   Specimen: Nasopharyngeal Swab; Nasopharyngeal(NP) swabs in vial transport medium  Result Value Ref Range Status   SARS Coronavirus 2 by RT PCR NEGATIVE NEGATIVE Final     Comment: (NOTE) SARS-CoV-2 target nucleic acids are NOT DETECTED.  The SARS-CoV-2 RNA is generally detectable in upper respiratory specimens during the acute phase of infection. The lowest concentration of SARS-CoV-2 viral copies this assay can detect is 138 copies/mL. A negative result does not preclude SARS-Cov-2 infection and should not be used as the sole basis for treatment or other patient management decisions. A negative result may occur with  improper specimen collection/handling, submission of specimen other than nasopharyngeal swab, presence of viral mutation(s) within the areas targeted by  this assay, and inadequate number of viral copies(<138 copies/mL). A negative result must be combined with clinical observations, patient history, and epidemiological information. The expected result is Negative.  Fact Sheet for Patients:  BloggerCourse.com  Fact Sheet for Healthcare Providers:  SeriousBroker.it  This test is no t yet approved or cleared by the [place] FDA and  has been authorized for detection and/or diagnosis of SARS-CoV-2 by FDA under an Emergency Use Authorization (EUA). This EUA will remain  in effect (meaning this test can be used) for the duration of the COVID-19 declaration under Section 564(b)(1) of the Act, 21 U.S.C.section 360bbb-3(b)(1), unless the authorization is terminated  or revoked sooner.       Influenza A by PCR NEGATIVE NEGATIVE Final   Influenza B by PCR NEGATIVE NEGATIVE Final    Comment: (NOTE) The Xpert Xpress SARS-CoV-2/FLU/RSV plus assay is intended as an aid in the diagnosis of influenza from Nasopharyngeal swab specimens and should not be used as a sole basis for treatment. Nasal washings and aspirates are unacceptable for Xpert Xpress SARS-CoV-2/FLU/RSV testing.  Fact Sheet for Patients: BloggerCourse.com  Fact Sheet for Healthcare  Providers: SeriousBroker.it  This test is not yet approved or cleared by the [place] FDA and has been authorized for detection and/or diagnosis of SARS-CoV-2 by FDA under an Emergency Use Authorization (EUA). This EUA will remain in effect (meaning this test can be used) for the duration of the COVID-19 declaration under Section 564(b)(1) of the Act, 21 U.S.C. section 360bbb-3(b)(1), unless the authorization is terminated or revoked.  Performed at [facility], [address] [place], [place] [address]   MRSA PCR Screening     Status: None   Collection Time: 06/05/20  2:12 AM   Specimen: Nasopharyngeal  Result Value Ref Range Status   MRSA by PCR NEGATIVE NEGATIVE Final    Comment:        The GeneXpert MRSA Assay (FDA approved for NASAL specimens only), is one component of a comprehensive MRSA colonization surveillance program. It is not intended to diagnose MRSA infection nor to guide or monitor treatment for MRSA infections. Performed at [facility], [address], [place], [place] [address]   Gastrointestinal Panel by PCR , Stool     Status: None   Collection Time: 06/06/20  7:22 AM   Specimen: Stool  Result Value Ref Range Status   Campylobacter species NOT DETECTED NOT DETECTED Final   Plesimonas shigelloides NOT DETECTED NOT DETECTED Final   Salmonella species NOT DETECTED NOT DETECTED Final   Yersinia enterocolitica NOT DETECTED NOT DETECTED Final   Vibrio species NOT DETECTED NOT DETECTED Final   Vibrio cholerae NOT DETECTED NOT DETECTED Final   Enteroaggregative E coli (EAEC) NOT DETECTED NOT DETECTED Final   Enteropathogenic E coli (EPEC) NOT DETECTED NOT DETECTED Final   Enterotoxigenic E coli (ETEC) NOT DETECTED NOT DETECTED Final   Shiga like toxin producing E coli (STEC) NOT DETECTED NOT DETECTED Final   Shigella/Enteroinvasive E coli (EIEC) NOT DETECTED NOT DETECTED Final   Cryptosporidium NOT DETECTED NOT  DETECTED Final   Cyclospora cayetanensis NOT DETECTED NOT DETECTED Final   Entamoeba histolytica NOT DETECTED NOT DETECTED Final   Giardia lamblia NOT DETECTED NOT DETECTED Final   Adenovirus F40/41 NOT DETECTED NOT DETECTED Final   Astrovirus NOT DETECTED NOT DETECTED Final   Norovirus GI/GII NOT DETECTED NOT DETECTED Final   Rotavirus A NOT DETECTED NOT DETECTED Final   Sapovirus (I, II, IV,  and V) NOT DETECTED NOT DETECTED Final    Comment: Performed at [facility], [address] [place], [place] [address]  C Difficile Quick Screen (NO PCR Reflex)     Status: None   Collection Time: 06/06/20  7:22 AM   Specimen: STOOL  Result Value Ref Range Status   C Diff antigen NEGATIVE NEGATIVE Final   C Diff toxin NEGATIVE NEGATIVE Final   C Diff interpretation No C. difficile detected.  Final    Comment: Performed at [facility], [address] [place], [place] [address]     Discharge Instructions:   Discharge Instructions    Diet Carb Modified   Complete by: As directed    Discharge instructions   Complete by: As directed    BMP 1 week Needs to follow up with GI doctor   Increase activity slowly   Complete by: As directed      Allergies as of 06/11/2020      Reactions   Levofloxacin    Other reaction(s): Cramps (ALLERGY/intolerance) Leg cramps   Penicillins Hives, Rash, Swelling   Paralyze from waist down [place]      Medication List    STOP taking these medications   amLODipine 5 MG tablet Commonly known as: NORVASC   lisinopril 40 MG tablet Commonly known as: ZESTRIL   metFORMIN 500 MG tablet Commonly known as: GLUCOPHAGE     TAKE these medications   acetaminophen 325 MG tablet Commonly known as: TYLENOL Take 650 mg by mouth every 6 (six) hours as needed for mild pain, fever or headache.   acidophilus Caps capsule Take 1 capsule by mouth daily. Start taking on: June 12, 2020   albuterol 108 (90 Base) MCG/ACT inhaler Commonly known as:  VENTOLIN HFA Inhale 2 puffs into the lungs every 4 (four) hours as needed for wheezing or shortness of breath.   Anoro Ellipta 62.5-25 MCG/INH Aepb Generic drug: umeclidinium-vilanterol Inhale 1 puff into the lungs daily.   aspirin 81 MG EC tablet Take by mouth.   atorvastatin 20 MG tablet Commonly known as: LIPITOR Take 20 mg by mouth daily.   Cholecalciferol 25 MCG (1000 UT) tablet Take by mouth.   furosemide 40 MG tablet Commonly known as: LASIX Take 1 tablet (40 mg total) by mouth daily. Start taking on: June 12, 2020 What changed:   medication strength  how much to take   glipiZIDE 5 MG 24 hr tablet Commonly known as: GLUCOTROL XL Take 5 mg by mouth daily with breakfast.   loperamide 2 MG capsule Commonly known as: IMODIUM Take 1 capsule (2 mg total) by mouth 2 (two) times daily.   Magnesium 200 MG Tabs Take 200 mg by mouth daily.   metoprolol succinate 50 MG 24 hr tablet Commonly known as: TOPROL-XL Take 50 mg by mouth daily.   pantoprazole 40 MG tablet Commonly known as: PROTONIX Take 40 mg by mouth daily.   zolpidem 10 MG tablet Commonly known as: AMBIEN Take 10 mg by mouth at bedtime as needed for sleep.       Follow-up Information    Hope Pigeon B, DO Follow up in 1 week(s).   Specialty: Internal Medicine Why: BMP-- needs to follow up with GI Contact information: [address] [place] [address] [phone]                Time coordinating discharge: 35 min  Signed:  Joseph Art DO  [facility] 06/11/2020,  11:54 AM

## 2020-06-11 NOTE — Plan of Care (Signed)

## 2020-06-11 NOTE — Progress Notes (Signed)
RN went over discharge info with pt. IVs removed. Belongings with patient.

## 2021-08-06 IMAGING — CT CT ABD-PELV W/O CM
2 of 4 series · 16 of 46 positions shown, 18 images · non-contrast
Comparison: 12/13/2019

CLINICAL DATA: Vomiting and diarrhea

EXAM:
CT ABDOMEN AND PELVIS WITHOUT CONTRAST
TECHNIQUE: Multidetector CT imaging of the abdomen and pelvis was performed
following the standard protocol without IV contrast.

[Series 2: axial st · axial · 0.83mm/px · z∈[-505,-25]mm · 13 of 106 slices shown, 15 images]
[im 5/106  soft-tissue]
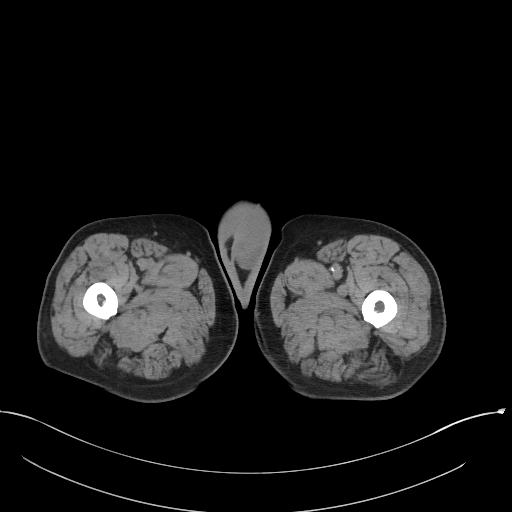
[im 5/106  bone]
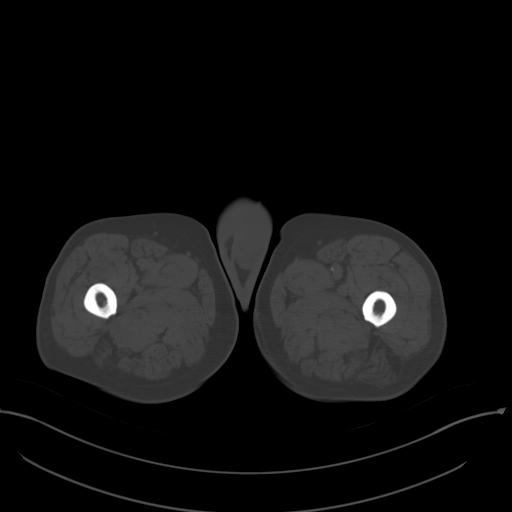
[im 13/106  soft-tissue]
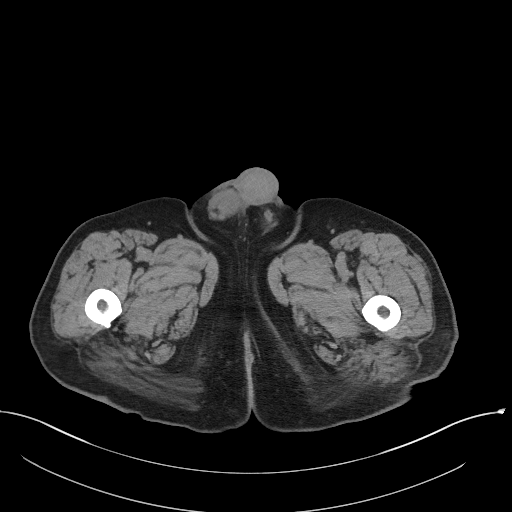
[im 22/106  soft-tissue]
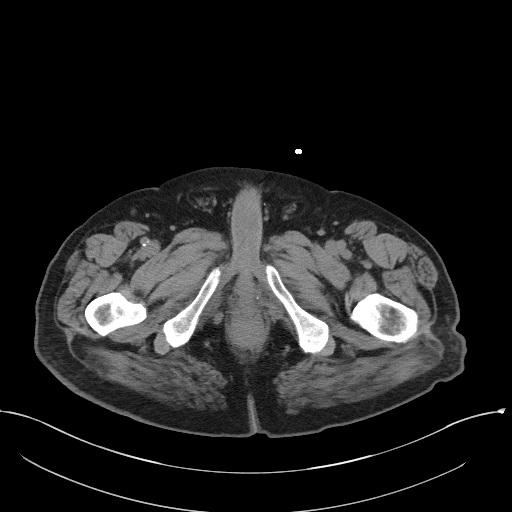
[im 30/106  soft-tissue]
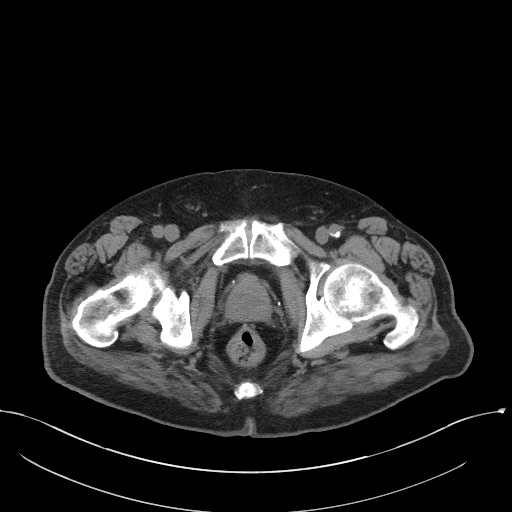
[im 38/106  soft-tissue]
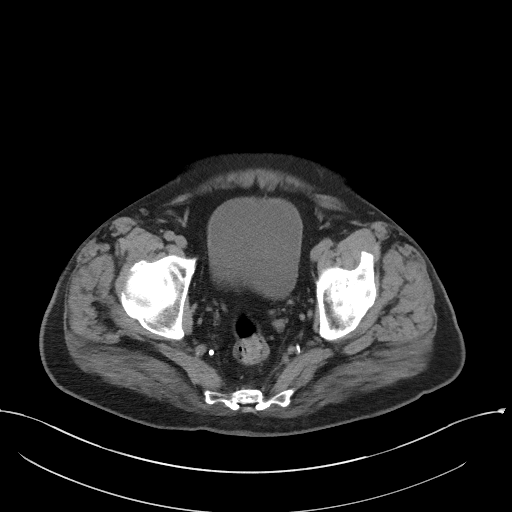
[im 47/106  soft-tissue]
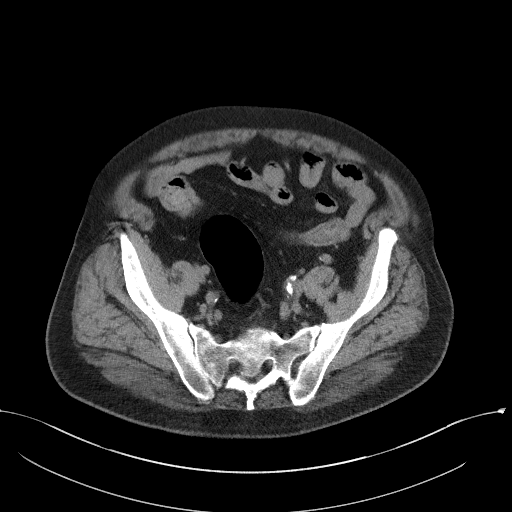
[im 55/106  soft-tissue]
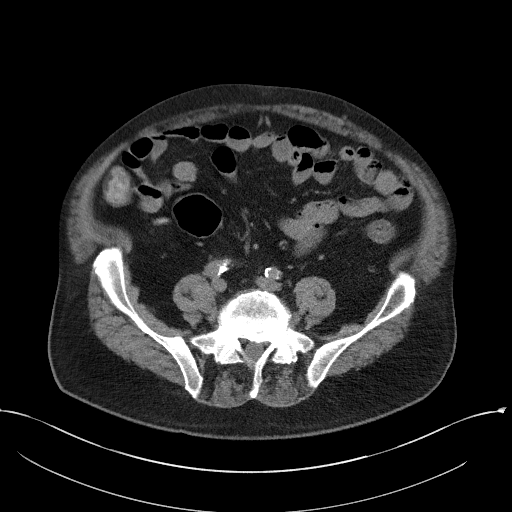
[im 59/106  soft-tissue]
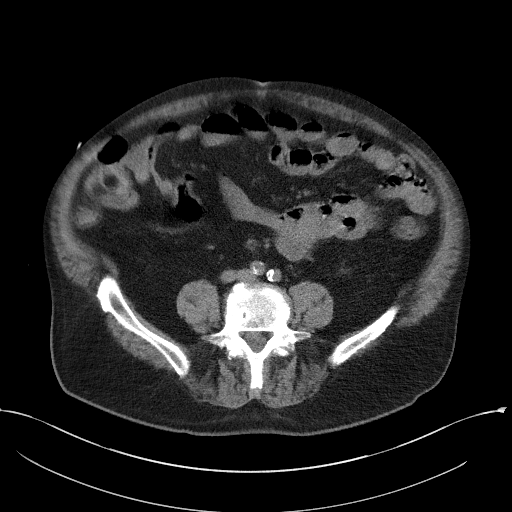
[im 68/106  soft-tissue]
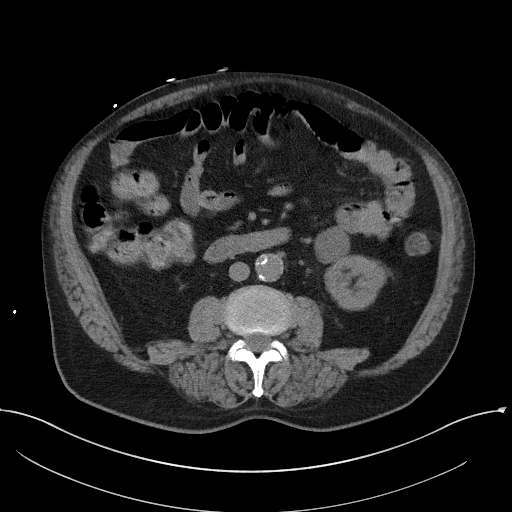
[im 68/106  bone]
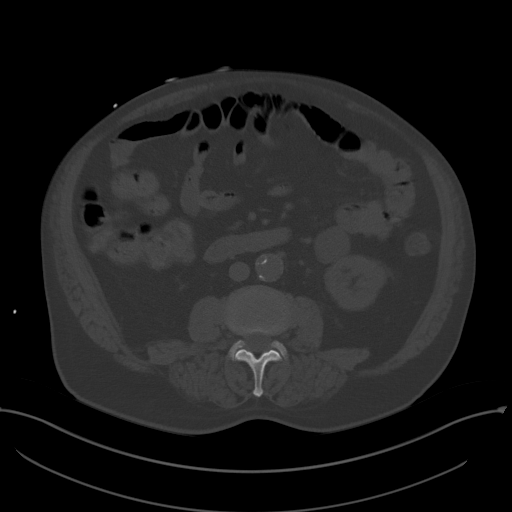
[im 76/106  soft-tissue]
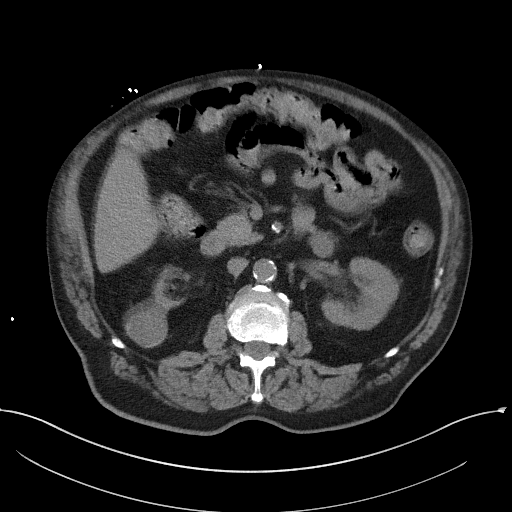
[im 85/106  soft-tissue]
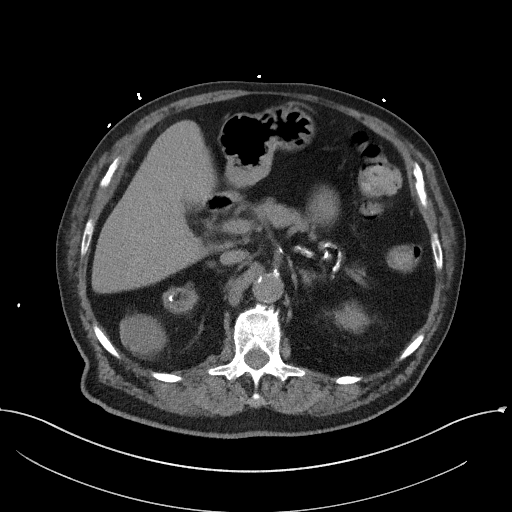
[im 93/106  soft-tissue]
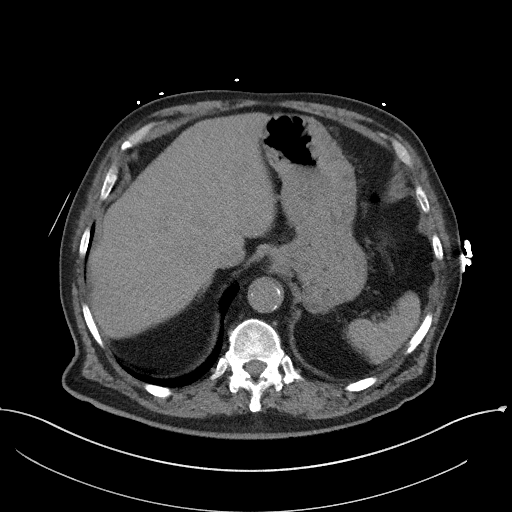
[im 101/106  soft-tissue]
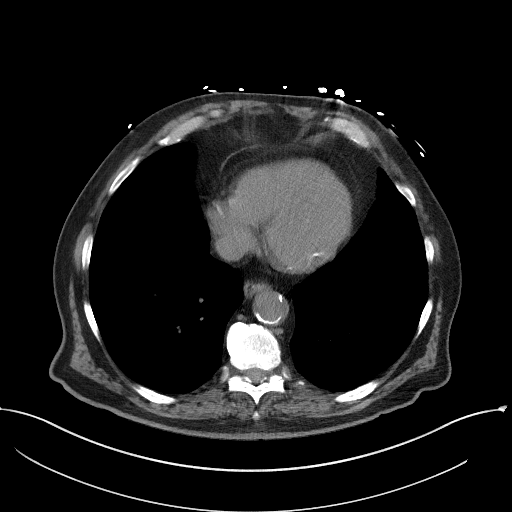

[Series 5: coronal st · coronal · 0.80mm/px · 3 of 106 slices shown]
[im 36/106  soft-tissue]
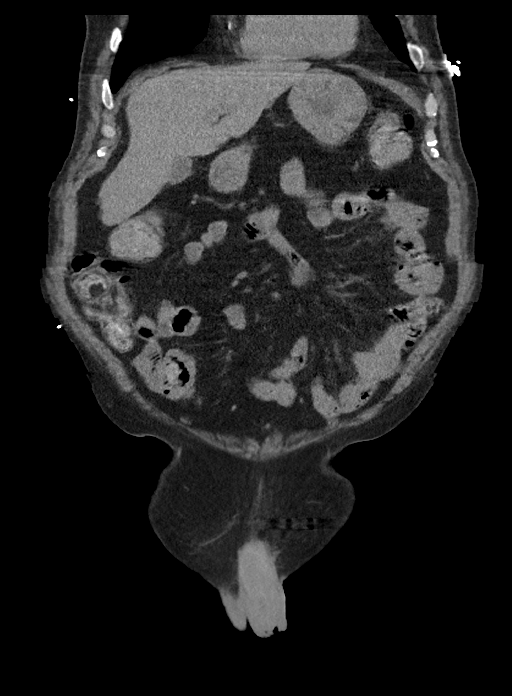
[im 47/106  soft-tissue]
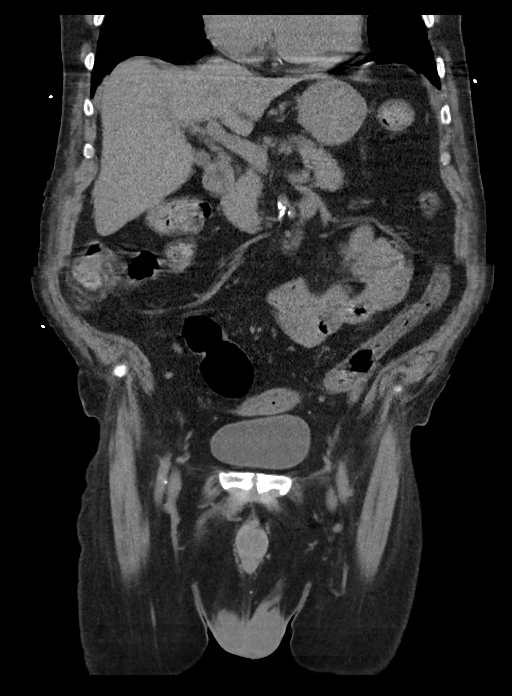
[im 59/106  soft-tissue]
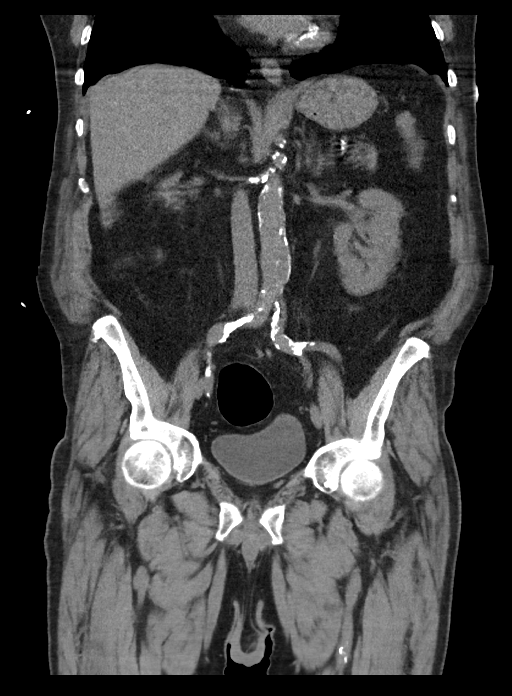

[16 of 46 positions shown; findings below may reference images not displayed]

FINDINGS: Lower chest: No acute abnormality.

Hepatobiliary: Few punctate calcified granulomas. Gallbladder is
unremarkable. No biliary dilatation.

Pancreas: Unremarkable.

Spleen: Unremarkable.

Adrenals/Urinary Tract: Adrenals are unremarkable. Atrophic right
kidney unchanged partially calcified upper pole cyst and
nonobstructing upper pole renal calculus. Left renal cysts
unchanged. Bladder is unremarkable.

Stomach/Bowel: Stomach is within normal limits. Bowel is normal in
caliber. Normal appendix.

Vascular/Lymphatic: Aortic atherosclerosis. Some intimal
calcification is displaced relative to the outer wall and may
reflect chronic dissection. This is unchanged. No enlarged lymph
nodes.

Reproductive: Stable appearance of prostate.

Other: No ascites.  No acute abnormality of the abdominal wall.

Musculoskeletal: No acute osseous abnormality. Lumbar degenerative
changes are greatest at L5-S1.
IMPRESSION: No acute abnormality.

Aortic atherosclerosis with possible chronic dissection.
